# Patient Record
Sex: Female | Born: 1990 | Hispanic: Yes | Marital: Married | Smoking: Never smoker
Health system: Southern US, Community
[De-identification: ages and names within clinical notes are randomized; demographics above are authoritative.]

## PROBLEM LIST (undated history)

## (undated) ENCOUNTER — Ambulatory Visit: Admission: EM | Payer: Medicaid Other

## (undated) ENCOUNTER — Ambulatory Visit: Admission: EM | Payer: Medicaid Other | Source: Home / Self Care

## (undated) DIAGNOSIS — G43909 Migraine, unspecified, not intractable, without status migrainosus: Secondary | ICD-10-CM

## (undated) DIAGNOSIS — J45909 Unspecified asthma, uncomplicated: Secondary | ICD-10-CM

---
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
## 2012-11-25 ENCOUNTER — Emergency Department: Payer: Self-pay | Admitting: Emergency Medicine

## 2013-03-19 ENCOUNTER — Emergency Department: Payer: Self-pay | Admitting: Emergency Medicine

## 2013-03-19 LAB — CBC
HCT: 39.4 % (ref 35.0–47.0)
HGB: 13.8 g/dL (ref 12.0–16.0)
MCH: 31.9 pg (ref 26.0–34.0)
Platelet: 260 10*3/uL (ref 150–440)
RBC: 4.33 10*6/uL (ref 3.80–5.20)
RDW: 13 % (ref 11.5–14.5)
WBC: 8.4 10*3/uL (ref 3.6–11.0)

## 2013-03-19 LAB — URINALYSIS, COMPLETE
Glucose,UR: NEGATIVE mg/dL (ref 0–75)
Ketone: NEGATIVE
Nitrite: NEGATIVE
Protein: NEGATIVE
Specific Gravity: 1.008 (ref 1.003–1.030)
Squamous Epithelial: <1

## 2013-03-19 LAB — COMPREHENSIVE METABOLIC PANEL
Albumin: 4.2 g/dL (ref 3.8–5.6)
BUN: 11 mg/dL (ref 7–18)
Bilirubin,Total: 0.7 mg/dL (ref 0.2–1.0)
Calcium, Total: 8.7 mg/dL — ABNORMAL LOW (ref 9.0–10.7)
Co2: 29 mmol/L (ref 21–32)
Creatinine: 0.55 mg/dL — ABNORMAL LOW (ref 0.60–1.30)
EGFR (Non-African Amer.): >60
Glucose: 82 mg/dL (ref 65–99)
Potassium: 3.7 mmol/L (ref 3.5–5.1)
SGOT(AST): 24 U/L (ref 0–26)
SGPT (ALT): 37 U/L (ref 12–78)
Total Protein: 7.6 g/dL (ref 6.4–8.6)

## 2013-05-20 ENCOUNTER — Emergency Department: Payer: Self-pay | Admitting: Emergency Medicine

## 2013-05-20 LAB — BASIC METABOLIC PANEL
BUN: 9 mg/dL (ref 7–18)
Co2: 27 mmol/L (ref 21–32)
Creatinine: 0.59 mg/dL — ABNORMAL LOW (ref 0.60–1.30)
EGFR (African American): >60
EGFR (Non-African Amer.): >60
Glucose: 93 mg/dL (ref 65–99)
Osmolality: 278 (ref 275–301)

## 2014-05-25 ENCOUNTER — Emergency Department: Payer: Self-pay | Admitting: Internal Medicine

## 2014-05-25 LAB — COMPREHENSIVE METABOLIC PANEL
ANION GAP: 6 — AB (ref 7–16)
Albumin: 3.8 g/dL (ref 3.4–5.0)
Alkaline Phosphatase: 67 U/L
BILIRUBIN TOTAL: 0.6 mg/dL (ref 0.2–1.0)
BUN: 6 mg/dL — AB (ref 7–18)
CHLORIDE: 108 mmol/L — AB (ref 98–107)
Calcium, Total: 9.1 mg/dL (ref 8.5–10.1)
Co2: 24 mmol/L (ref 21–32)
Creatinine: 0.76 mg/dL (ref 0.60–1.30)
EGFR (African American): >60
EGFR (Non-African Amer.): >60
Glucose: 84 mg/dL (ref 65–99)
Osmolality: 272 (ref 275–301)
Potassium: 3.8 mmol/L (ref 3.5–5.1)
SGOT(AST): 23 U/L (ref 15–37)
SGPT (ALT): 21 U/L (ref 12–78)
Sodium: 138 mmol/L (ref 136–145)
Total Protein: 7.4 g/dL (ref 6.4–8.2)

## 2014-05-25 LAB — CBC
HCT: 38.5 % (ref 35.0–47.0)
HGB: 13.2 g/dL (ref 12.0–16.0)
MCH: 31.5 pg (ref 26.0–34.0)
MCHC: 34.2 g/dL (ref 32.0–36.0)
MCV: 92 fL (ref 80–100)
Platelet: 271 10*3/uL (ref 150–440)
RBC: 4.19 10*6/uL (ref 3.80–5.20)
RDW: 12.5 % (ref 11.5–14.5)
WBC: 14.1 10*3/uL — ABNORMAL HIGH (ref 3.6–11.0)

## 2014-05-25 LAB — URINALYSIS, COMPLETE
Bilirubin,UR: NEGATIVE
Glucose,UR: NEGATIVE mg/dL (ref 0–75)
KETONE: NEGATIVE
Nitrite: NEGATIVE
PH: 6 (ref 4.5–8.0)
Protein: NEGATIVE
RBC,UR: 30 /HPF (ref 0–5)
SPECIFIC GRAVITY: 1.013 (ref 1.003–1.030)

## 2014-05-27 LAB — URINE CULTURE

## 2014-07-11 ENCOUNTER — Emergency Department: Payer: Self-pay | Admitting: Emergency Medicine

## 2015-08-15 ENCOUNTER — Emergency Department: Payer: Self-pay

## 2015-08-15 ENCOUNTER — Encounter: Payer: Self-pay | Admitting: Emergency Medicine

## 2015-08-15 ENCOUNTER — Emergency Department
Admission: EM | Admit: 2015-08-15 | Discharge: 2015-08-15 | Disposition: A | Discharge status: Home / Self Care | Payer: Self-pay | Attending: Student | Admitting: Student

## 2015-08-15 DIAGNOSIS — Z3202 Encounter for pregnancy test, result negative: Secondary | ICD-10-CM | POA: Insufficient documentation

## 2015-08-15 DIAGNOSIS — G43001 Migraine without aura, not intractable, with status migrainosus: Secondary | ICD-10-CM | POA: Insufficient documentation

## 2015-08-15 LAB — POCT PREGNANCY, URINE: Preg Test, Ur: NEGATIVE

## 2015-08-15 MED ORDER — SODIUM CHLORIDE 0.9 % IV BOLUS (SEPSIS)
1000.0000 mL | Freq: Once | INTRAVENOUS | Status: AC
  Administered 2015-08-15: 1000 mL via INTRAVENOUS

## 2015-08-15 MED ORDER — KETOROLAC TROMETHAMINE 30 MG/ML IJ SOLN
30.0000 mg | Freq: Once | INTRAMUSCULAR | Status: AC
  Administered 2015-08-15: 30 mg via INTRAVENOUS
  Filled 2015-08-15: qty 1

## 2015-08-15 MED ORDER — DIPHENHYDRAMINE HCL 50 MG/ML IJ SOLN
12.5000 mg | Freq: Once | INTRAMUSCULAR | Status: AC
  Administered 2015-08-15: 12.5 mg via INTRAVENOUS

## 2015-08-15 MED ORDER — DIPHENHYDRAMINE HCL 12.5 MG/5ML PO ELIX
12.5000 mg | ORAL_SOLUTION | Freq: Once | ORAL | Status: DC
  Filled 2015-08-15: qty 5

## 2015-08-15 MED ORDER — BUTALBITAL-APAP-CAFFEINE 50-325-40 MG PO TABS: 1.0000 | ORAL_TABLET | Freq: Four times a day (QID) | ORAL | Status: DC | PRN

## 2015-08-15 MED ORDER — DIPHENHYDRAMINE HCL 50 MG/ML IJ SOLN
INTRAMUSCULAR | Status: AC
  Administered 2015-08-15: 12.5 mg via INTRAVENOUS
  Filled 2015-08-15: qty 1

## 2015-08-15 MED ORDER — METOCLOPRAMIDE HCL 5 MG/ML IJ SOLN
20.0000 mg | Freq: Once | INTRAMUSCULAR | Status: AC
  Administered 2015-08-15: 20 mg via INTRAVENOUS
  Filled 2015-08-15 (×2): qty 4

## 2015-08-15 NOTE — ED Provider Notes (Signed)
[facility] Emergency Department Provider Note  ____________________________________________  Time seen: Approximately 12:55 PM  I have reviewed the triage vital signs and the nursing notes.   HISTORY  Chief Complaint Headache    HPI Tiffany Diaz is a 24 y.o. female patient complaining of severe headache which started last night. Patient stated work it became increasingly more painful and she began has not been tenderness in the upper extremities. Patient state her coworkers said if she seems to be disorientated. Patient has a history of migraine headaches has not had a headache in over a year. No palliative measures taken of his complaint. Patient is rating the headache as a 10 over 10 systems were she's ever had.   History reviewed. No pertinent past medical history.  There are no active problems to display for this patient.   History reviewed. No pertinent past surgical history.  Current Outpatient Rx  Name  Route  Sig  Dispense  Refill  . butalbital-acetaminophen-caffeine (FIORICET) 50-325-40 MG per tablet   Oral   Take 1-2 tablets by mouth every 6 (six) hours as needed for headache.   20 tablet   0     Allergies Morphine and related  History reviewed. No pertinent family history.  Social History Social History  Substance Use Topics  . Smoking status: Never Smoker   . Smokeless tobacco: None  . Alcohol Use: No    Review of Systems Constitutional: No fever/chills Eyes: No visual changes. ENT: No sore throat. Cardiovascular: Denies chest pain. Respiratory: Denies shortness of breath. Gastrointestinal: No abdominal pain.  No nausea, no vomiting.  No diarrhea.  No constipation. Genitourinary: Negative for dysuria. Musculoskeletal: Negative for back pain. Skin: Negative for rash. Neurological: Positive for headache denies weakness but states she's having some bilateral numbness in upper extremities. 10-point ROS otherwise  negative.  ____________________________________________   PHYSICAL EXAM:  VITAL SIGNS: ED Triage Vitals  Enc Vitals Group     BP 08/15/15 1203 148/91 mmHg     Pulse Rate 08/15/15 1203 78     Resp 08/15/15 1203 20     Temp 08/15/15 1203 98.3 F (36.8 C)     Temp Source 08/15/15 1203 Oral     SpO2 08/15/15 1203 100 %     Weight 08/15/15 1203 180 lb (81.647 kg)     Height 08/15/15 1203  (1.6 m)     Head Cir --      Peak Flow --      Pain Score 08/15/15 1204 10     Pain Loc --      Pain Edu? --      Excl. in GC? --     Constitutional: Alert and oriented. Well appearing and in no acute distress. Eyes: Conjunctivae are normal. PERRL. EOMI. Head: Atraumatic. Nose: No congestion/rhinnorhea. Mouth/Throat: Mucous membranes are moist.  Oropharynx non-erythematous. Neck: No stridor.   Hematological/Lymphatic/Immunilogical: No cervical lymphadenopathy. Cardiovascular: Normal rate, regular rhythm. Grossly normal heart sounds.  Good peripheral circulation. Respiratory: Normal respiratory effort.  No retractions. Lungs CTAB. Gastrointestinal: Soft and nontender. No distention. No abdominal bruits. No CVA tenderness. Musculoskeletal: No lower extremity tenderness nor edema.  No joint effusions. Neurologic:  Normal speech and language. No gross focal neurologic deficits are appreciated. No gait instability. Skin:  Skin is warm, dry and intact. No rash noted. Psychiatric: Mood and affect are normal. Speech and behavior are normal.  ____________________________________________   LABS (all labs ordered are listed, but only abnormal results are displayed)  Labs Reviewed  POC URINE PREG, ED  POCT PREGNANCY, URINE   ____________________________________________  EKG   ____________________________________________  RADIOLOGY  CT scan was unremarkable. ____________________________________________   PROCEDURES  Procedure(s) performed: None  Critical Care performed:  No  ____________________________________________   INITIAL IMPRESSION / ASSESSMENT AND PLAN / ED COURSE  Pertinent labs & imaging results that were available during my care of the patient were reviewed by me and considered in my medical decision making (see chart for details). Migraine headache. Patient was given Toradol, Reglan, Benadryl and IV hydration. H will be discharged prescription for Esgic. Patient given a work note and advised to follow-up with her PCP._______________________________________   FINAL CLINICAL IMPRESSION(S) / ED DIAGNOSES  Final diagnoses:  Migraine without aura and with status migrainosus, not intractable      Joni Reining, PA-C 08/15/15 1545  Joni Reining, PA-C 08/15/15 1551  Gayla Doss, MD 08/15/15 1555

## 2015-08-15 NOTE — Discharge Instructions (Signed)

## 2015-08-15 NOTE — ED Notes (Signed)
Patient to ED with report of headache that started last night, this morning headache became increasingly worse and she began having numbness and tingling in her fingers.
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
## 2018-08-12 DIAGNOSIS — Z8669 Personal history of other diseases of the nervous system and sense organs: Secondary | ICD-10-CM | POA: Insufficient documentation

## 2018-08-12 DIAGNOSIS — Z91410 Personal history of adult physical and sexual abuse: Secondary | ICD-10-CM | POA: Insufficient documentation

## 2018-08-12 LAB — HM PAP SMEAR: HM Pap smear: NEGATIVE

## 2018-08-12 LAB — HIV ANTIBODY (ROUTINE TESTING W REFLEX): HIV: NONREACTIVE

## 2018-08-13 DIAGNOSIS — J45909 Unspecified asthma, uncomplicated: Secondary | ICD-10-CM | POA: Insufficient documentation

## 2018-08-31 DIAGNOSIS — F4323 Adjustment disorder with mixed anxiety and depressed mood: Secondary | ICD-10-CM | POA: Insufficient documentation

## 2018-11-04 ENCOUNTER — Ambulatory Visit: Payer: Self-pay

## 2018-11-08 DIAGNOSIS — Z6841 Body Mass Index (BMI) 40.0 and over, adult: Secondary | ICD-10-CM

## 2019-01-05 DIAGNOSIS — F4323 Adjustment disorder with mixed anxiety and depressed mood: Secondary | ICD-10-CM

## 2019-01-05 DIAGNOSIS — Z6841 Body Mass Index (BMI) 40.0 and over, adult: Secondary | ICD-10-CM

## 2019-01-05 DIAGNOSIS — Z91410 Personal history of adult physical and sexual abuse: Secondary | ICD-10-CM

## 2019-01-05 DIAGNOSIS — Z8669 Personal history of other diseases of the nervous system and sense organs: Secondary | ICD-10-CM

## 2019-01-08 ENCOUNTER — Encounter: Payer: Self-pay | Admitting: Gynecology

## 2019-01-08 ENCOUNTER — Ambulatory Visit
Admission: EM | Admit: 2019-01-08 | Discharge: 2019-01-08 | Disposition: A | Discharge status: Home / Self Care | Payer: BC Managed Care – PPO | Attending: Family Medicine | Admitting: Family Medicine

## 2019-01-08 ENCOUNTER — Other Ambulatory Visit: Payer: Self-pay

## 2019-01-08 DIAGNOSIS — R0982 Postnasal drip: Secondary | ICD-10-CM | POA: Diagnosis not present

## 2019-01-08 DIAGNOSIS — Z3202 Encounter for pregnancy test, result negative: Secondary | ICD-10-CM

## 2019-01-08 DIAGNOSIS — R5383 Other fatigue: Secondary | ICD-10-CM | POA: Diagnosis not present

## 2019-01-08 DIAGNOSIS — J111 Influenza due to unidentified influenza virus with other respiratory manifestations: Secondary | ICD-10-CM

## 2019-01-08 DIAGNOSIS — R05 Cough: Secondary | ICD-10-CM | POA: Diagnosis not present

## 2019-01-08 DIAGNOSIS — R0981 Nasal congestion: Secondary | ICD-10-CM | POA: Diagnosis not present

## 2019-01-08 DIAGNOSIS — R69 Illness, unspecified: Secondary | ICD-10-CM

## 2019-01-08 HISTORY — DX: Migraine, unspecified, not intractable, without status migrainosus: G43.909

## 2019-01-08 HISTORY — DX: Unspecified asthma, uncomplicated: J45.909

## 2019-01-08 LAB — PREGNANCY, URINE: Preg Test, Ur: NEGATIVE

## 2019-01-08 MED ORDER — OSELTAMIVIR PHOSPHATE 75 MG PO CAPS: 75.0000 mg | ORAL_CAPSULE | Freq: Two times a day (BID) | ORAL | 0 refills | Status: DC
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
## 2019-01-08 NOTE — ED Triage Notes (Signed)
Patient c/o headache and fever this morning of 100.2 and body ache.

## 2019-05-02 ENCOUNTER — Encounter: Payer: Self-pay | Admitting: Emergency Medicine

## 2019-05-02 ENCOUNTER — Other Ambulatory Visit: Payer: Self-pay

## 2019-05-02 ENCOUNTER — Emergency Department
Admission: EM | Admit: 2019-05-02 | Discharge: 2019-05-02 | Disposition: A | Discharge status: Home / Self Care | Payer: BC Managed Care – PPO | Attending: Emergency Medicine | Admitting: Emergency Medicine

## 2019-05-02 ENCOUNTER — Emergency Department: Payer: BC Managed Care – PPO

## 2019-05-02 DIAGNOSIS — N939 Abnormal uterine and vaginal bleeding, unspecified: Secondary | ICD-10-CM | POA: Diagnosis not present

## 2019-05-02 DIAGNOSIS — J45909 Unspecified asthma, uncomplicated: Secondary | ICD-10-CM | POA: Diagnosis not present

## 2019-05-02 DIAGNOSIS — Z79899 Other long term (current) drug therapy: Secondary | ICD-10-CM | POA: Diagnosis not present

## 2019-05-02 LAB — WET PREP, GENITAL
Clue Cells Wet Prep HPF POC: NONE SEEN
Sperm: NONE SEEN
Trich, Wet Prep: NONE SEEN
Yeast Wet Prep HPF POC: NONE SEEN

## 2019-05-02 LAB — CHLAMYDIA/NGC RT PCR (ARMC ONLY)??????????
Chlamydia Tr: NOT DETECTED
N gonorrhoeae: NOT DETECTED

## 2019-05-02 LAB — URINALYSIS, COMPLETE (UACMP) WITH MICROSCOPIC
Bacteria, UA: NONE SEEN
Bilirubin Urine: NEGATIVE
Glucose, UA: NEGATIVE mg/dL
Hgb urine dipstick: NEGATIVE
Ketones, ur: NEGATIVE mg/dL
Leukocytes,Ua: NEGATIVE
Nitrite: NEGATIVE
Protein, ur: NEGATIVE mg/dL
Specific Gravity, Urine: 1.023 (ref 1.005–1.030)
pH: 5 (ref 5.0–8.0)

## 2019-05-02 LAB — POCT PREGNANCY, URINE: Preg Test, Ur: NEGATIVE

## 2019-05-02 MED ORDER — SUMATRIPTAN SUCCINATE 100 MG PO TABS: ORAL_TABLET | ORAL | 0 refills | Status: DC

## 2019-05-02 MED ORDER — IBUPROFEN 600 MG PO TABS: 600.0000 mg | ORAL_TABLET | Freq: Three times a day (TID) | ORAL | 0 refills | Status: DC | PRN

## 2019-05-02 MED ORDER — MEDROXYPROGESTERONE ACETATE 5 MG PO TABS: 5.0000 mg | ORAL_TABLET | Freq: Every day | ORAL | 0 refills | Status: DC

## 2019-05-02 MED ORDER — ONDANSETRON 4 MG PO TBDP: 4.0000 mg | ORAL_TABLET | Freq: Three times a day (TID) | ORAL | 0 refills | Status: DC | PRN

## 2019-05-02 MED ORDER — KETOROLAC TROMETHAMINE 60 MG/2ML IM SOLN
60.0000 mg | Freq: Once | INTRAMUSCULAR | Status: AC
  Administered 2019-05-02: 60 mg via INTRAMUSCULAR
  Filled 2019-05-02: qty 2

## 2019-05-02 NOTE — ED Notes (Signed)
Patient discharge instructions reviewed with patient by this RN and MD. Patient left after speaking with MD, prior to signing for discharge. Patient verbalized understanding of discharge instructions.

## 2019-05-02 NOTE — Discharge Instructions (Addendum)
Take the birth control as prescribed.  After the course of birth control, you should have a normal period.

## 2019-05-02 NOTE — ED Triage Notes (Addendum)
Pt presents with c/o one short episode of vaginal bleeding that occured around 9pm last night; says she passed a "blob that looked like chicken skin"; she thinks she may be having a miscarriage; pt says LMP was 04/06/19; she has taken a home pregnancy test and had a "very faint line"; pt says she has been nauseated since her last period and that's why she took the home pregnancy test; reports cramping to abd and mid back;
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
## 2019-12-16 ENCOUNTER — Other Ambulatory Visit: Payer: Self-pay

## 2019-12-16 DIAGNOSIS — R079 Chest pain, unspecified: Secondary | ICD-10-CM | POA: Insufficient documentation

## 2019-12-16 DIAGNOSIS — R0602 Shortness of breath: Secondary | ICD-10-CM | POA: Diagnosis not present

## 2019-12-16 DIAGNOSIS — Z5321 Procedure and treatment not carried out due to patient leaving prior to being seen by health care provider: Secondary | ICD-10-CM | POA: Insufficient documentation

## 2019-12-16 LAB — CBC WITH DIFFERENTIAL/PLATELET
Abs Immature Granulocytes: 0.02 10*3/uL (ref 0.00–0.07)
Basophils Absolute: 0 10*3/uL (ref 0.0–0.1)
Basophils Relative: 0 %
Eosinophils Absolute: 0.1 10*3/uL (ref 0.0–0.5)
Eosinophils Relative: 1 %
HCT: 36.8 % (ref 36.0–46.0)
Hemoglobin: 12.5 g/dL (ref 12.0–15.0)
Immature Granulocytes: 0 %
Lymphocytes Relative: 33 %
Lymphs Abs: 2.1 10*3/uL (ref 0.7–4.0)
MCH: 28.9 pg (ref 26.0–34.0)
MCHC: 34 g/dL (ref 30.0–36.0)
MCV: 85 fL (ref 80.0–100.0)
Monocytes Absolute: 0.5 10*3/uL (ref 0.1–1.0)
Monocytes Relative: 8 %
Neutro Abs: 3.7 10*3/uL (ref 1.7–7.7)
Neutrophils Relative %: 58 %
Platelets: 267 10*3/uL (ref 150–400)
RBC: 4.33 MIL/uL (ref 3.87–5.11)
RDW: 12.6 % (ref 11.5–15.5)
WBC: 6.4 10*3/uL (ref 4.0–10.5)
nRBC: 0 % (ref 0.0–0.2)

## 2019-12-16 LAB — POCT PREGNANCY, URINE: Preg Test, Ur: NEGATIVE

## 2019-12-16 NOTE — ED Triage Notes (Signed)
Patient reports short of breath since COVID + on 12/06/2019.  Patient reports she has had chest pain since Monday.  States sent by her MD to be checked for blood clot in her lungs.

## 2019-12-17 ENCOUNTER — Emergency Department
Admission: EM | Admit: 2019-12-17 | Discharge: 2019-12-17 | Disposition: A | Discharge status: Home / Self Care | Payer: BC Managed Care – PPO | Attending: Emergency Medicine | Admitting: Emergency Medicine

## 2019-12-17 ENCOUNTER — Emergency Department: Payer: BC Managed Care – PPO

## 2019-12-17 LAB — COMPREHENSIVE METABOLIC PANEL
ALT: 57 U/L — ABNORMAL HIGH (ref 0–44)
AST: 35 U/L (ref 15–41)
Albumin: 3.6 g/dL (ref 3.5–5.0)
Alkaline Phosphatase: 88 U/L (ref 38–126)
Anion gap: 10 (ref 5–15)
BUN: 8 mg/dL (ref 6–20)
CO2: 23 mmol/L (ref 22–32)
Calcium: 8.3 mg/dL — ABNORMAL LOW (ref 8.9–10.3)
Chloride: 106 mmol/L (ref 98–111)
Creatinine, Ser: 0.54 mg/dL (ref 0.44–1.00)
GFR calc Af Amer: >60 mL/min (ref >60)
GFR calc non Af Amer: >60 mL/min (ref >60)
Glucose, Bld: 100 mg/dL — ABNORMAL HIGH (ref 70–99)
Potassium: 3.7 mmol/L (ref 3.5–5.1)
Sodium: 139 mmol/L (ref 135–145)
Total Bilirubin: 0.7 mg/dL (ref 0.3–1.2)
Total Protein: 7.1 g/dL (ref 6.5–8.1)

## 2019-12-17 LAB — FIBRIN DERIVATIVES D-DIMER (ARMC ONLY): Fibrin derivatives D-dimer (ARMC): 256.37 ng/mL (FEU) (ref 0.00–499.00)

## 2019-12-17 LAB — TROPONIN I (HIGH SENSITIVITY): Troponin I (High Sensitivity): <2 ng/L (ref <18)

## 2019-12-17 NOTE — ED Notes (Signed)
Patient called for xray with no answer.

## 2019-12-17 NOTE — ED Notes (Signed)
Patient called for xray with no answer. 

## 2019-12-19 ENCOUNTER — Telehealth: Payer: Self-pay | Admitting: Emergency Medicine

## 2019-12-19 NOTE — Telephone Encounter (Signed)
Called patient due to lwot to inquire about condition and follow up plans. She says she has called her pcp. I told her that results should be visible to her pcp.

## 2020-03-26 ENCOUNTER — Other Ambulatory Visit: Payer: Self-pay

## 2020-03-26 ENCOUNTER — Emergency Department: Payer: BC Managed Care – PPO

## 2020-03-26 DIAGNOSIS — Z5321 Procedure and treatment not carried out due to patient leaving prior to being seen by health care provider: Secondary | ICD-10-CM | POA: Diagnosis not present

## 2020-03-26 DIAGNOSIS — R42 Dizziness and giddiness: Secondary | ICD-10-CM | POA: Diagnosis not present

## 2020-03-26 DIAGNOSIS — O99891 Other specified diseases and conditions complicating pregnancy: Secondary | ICD-10-CM | POA: Diagnosis not present

## 2020-03-26 DIAGNOSIS — Z3A01 Less than 8 weeks gestation of pregnancy: Secondary | ICD-10-CM | POA: Insufficient documentation

## 2020-03-26 DIAGNOSIS — R05 Cough: Secondary | ICD-10-CM | POA: Insufficient documentation

## 2020-03-26 DIAGNOSIS — R0602 Shortness of breath: Secondary | ICD-10-CM | POA: Diagnosis not present

## 2020-03-26 LAB — URINALYSIS, COMPLETE (UACMP) WITH MICROSCOPIC
Bilirubin Urine: NEGATIVE
Glucose, UA: NEGATIVE mg/dL
Hgb urine dipstick: NEGATIVE
Ketones, ur: NEGATIVE mg/dL
Leukocytes,Ua: NEGATIVE
Nitrite: NEGATIVE
Protein, ur: NEGATIVE mg/dL
Specific Gravity, Urine: 1.033 — ABNORMAL HIGH (ref 1.005–1.030)
pH: 5 (ref 5.0–8.0)

## 2020-03-26 LAB — CBC
HCT: 36.6 % (ref 36.0–46.0)
Hemoglobin: 12.3 g/dL (ref 12.0–15.0)
MCH: 29.5 pg (ref 26.0–34.0)
MCHC: 33.6 g/dL (ref 30.0–36.0)
MCV: 87.8 fL (ref 80.0–100.0)
Platelets: 335 10*3/uL (ref 150–400)
RBC: 4.17 MIL/uL (ref 3.87–5.11)
RDW: 13.1 % (ref 11.5–15.5)
WBC: 13.4 10*3/uL — ABNORMAL HIGH (ref 4.0–10.5)
nRBC: 0.2 % (ref 0.0–0.2)

## 2020-03-26 LAB — BASIC METABOLIC PANEL
Anion gap: 7 (ref 5–15)
BUN: 12 mg/dL (ref 6–20)
CO2: 23 mmol/L (ref 22–32)
Calcium: 8.8 mg/dL — ABNORMAL LOW (ref 8.9–10.3)
Chloride: 106 mmol/L (ref 98–111)
Creatinine, Ser: 0.66 mg/dL (ref 0.44–1.00)
GFR calc Af Amer: >60 mL/min (ref >60)
GFR calc non Af Amer: >60 mL/min (ref >60)
Glucose, Bld: 107 mg/dL — ABNORMAL HIGH (ref 70–99)
Potassium: 4 mmol/L (ref 3.5–5.1)
Sodium: 136 mmol/L (ref 135–145)

## 2020-03-26 LAB — POCT PREGNANCY, URINE: Preg Test, Ur: POSITIVE — AB

## 2020-03-26 NOTE — ED Triage Notes (Signed)
Pt arrives to ED via POV from home with c/o lightheadedness and SHOB x3-4 days. Pt reports productive cough with green mucus. Pt reports (+) nausea, but denies V/D. Pt is also [redacted] weeks pregnant; but without any pregnancy related c/o's. Pt is A&O, in NAD; RR even, regular, and unlabored.

## 2020-03-27 ENCOUNTER — Emergency Department
Admission: EM | Admit: 2020-03-27 | Discharge: 2020-03-27 | Disposition: A | Discharge status: Home / Self Care | Payer: BC Managed Care – PPO | Attending: Emergency Medicine | Admitting: Emergency Medicine

## 2020-03-30 ENCOUNTER — Encounter: Payer: Self-pay | Admitting: Emergency Medicine

## 2020-03-30 ENCOUNTER — Other Ambulatory Visit: Payer: Self-pay

## 2020-03-30 ENCOUNTER — Ambulatory Visit
Admission: EM | Admit: 2020-03-30 | Discharge: 2020-03-30 | Disposition: A | Discharge status: Home / Self Care | Payer: BC Managed Care – PPO | Attending: Urgent Care | Admitting: Urgent Care

## 2020-03-30 DIAGNOSIS — Z3A01 Less than 8 weeks gestation of pregnancy: Secondary | ICD-10-CM

## 2020-03-30 DIAGNOSIS — H66001 Acute suppurative otitis media without spontaneous rupture of ear drum, right ear: Secondary | ICD-10-CM

## 2020-03-30 DIAGNOSIS — R05 Cough: Secondary | ICD-10-CM

## 2020-03-30 DIAGNOSIS — R059 Cough, unspecified: Secondary | ICD-10-CM

## 2020-03-30 MED ORDER — AMOXICILLIN 875 MG PO TABS: 875.0000 mg | ORAL_TABLET | Freq: Two times a day (BID) | ORAL | 0 refills | Status: AC

## 2020-03-30 NOTE — Discharge Instructions (Addendum)
It was very nice seeing you today in clinic. Thank you for entrusting me with your care.   Rest and Stay HYDRATED. Water and electrolyte containing beverages (Gatorade, Pedialyte) are best to prevent dehydration and electrolyte abnormalities. Use medication as prescribed. Limited on what you can use during pregnancy. Continue Robitussin.  Make arrangements to follow up with your regular doctor in 1 week for re-evaluation if not improving. If your symptoms/condition worsens, please seek follow up care either here or in the ER. Please remember, our [facility] providers are "right here with you" when you need [place].   Again, it was my pleasure to take care of you today. Thank you for choosing our clinic. I hope that you start to feel better quickly.   Quentin Mulling, MSN, APRN, FNP-C, CEN Advanced Practice Provider [facility]

## 2020-03-30 NOTE — ED Triage Notes (Signed)
Patient was COVID tested on Tuesday and was Negative.

## 2020-03-30 NOTE — ED Triage Notes (Signed)
Patient c/o cough, congestion, post nasal drip, and headaches that started over a week ago.  Patient denies fevers.
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
## 2020-10-26 ENCOUNTER — Ambulatory Visit
Admission: EM | Admit: 2020-10-26 | Discharge: 2020-10-26 | Disposition: A | Discharge status: Home / Self Care | Payer: Medicaid Other | Attending: Emergency Medicine | Admitting: Emergency Medicine

## 2020-10-26 ENCOUNTER — Encounter: Payer: Self-pay | Admitting: Emergency Medicine

## 2020-10-26 ENCOUNTER — Other Ambulatory Visit: Payer: Self-pay

## 2020-10-26 DIAGNOSIS — Z20822 Contact with and (suspected) exposure to covid-19: Secondary | ICD-10-CM | POA: Insufficient documentation

## 2020-10-26 DIAGNOSIS — O99213 Obesity complicating pregnancy, third trimester: Secondary | ICD-10-CM | POA: Insufficient documentation

## 2020-10-26 DIAGNOSIS — J029 Acute pharyngitis, unspecified: Secondary | ICD-10-CM | POA: Diagnosis not present

## 2020-10-26 DIAGNOSIS — J069 Acute upper respiratory infection, unspecified: Secondary | ICD-10-CM

## 2020-10-26 DIAGNOSIS — J45909 Unspecified asthma, uncomplicated: Secondary | ICD-10-CM | POA: Insufficient documentation

## 2020-10-26 DIAGNOSIS — O99513 Diseases of the respiratory system complicating pregnancy, third trimester: Secondary | ICD-10-CM | POA: Insufficient documentation

## 2020-10-26 DIAGNOSIS — Z3A35 35 weeks gestation of pregnancy: Secondary | ICD-10-CM | POA: Insufficient documentation

## 2020-10-26 DIAGNOSIS — H9203 Otalgia, bilateral: Secondary | ICD-10-CM | POA: Insufficient documentation

## 2020-10-26 DIAGNOSIS — O26893 Other specified pregnancy related conditions, third trimester: Secondary | ICD-10-CM | POA: Insufficient documentation

## 2020-10-26 LAB — GROUP A STREP BY PCR: Group A Strep by PCR: NOT DETECTED

## 2020-10-26 LAB — RESP PANEL BY RT-PCR (FLU A&B, COVID) ARPGX2
Influenza A by PCR: NEGATIVE
Influenza B by PCR: NEGATIVE
SARS Coronavirus 2 by RT PCR: NEGATIVE
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
## 2020-10-26 NOTE — Discharge Instructions (Addendum)
I will call with the results of your strep test and respiratory swab in the next hour or 2.  I advised supportive care with increasing rest and fluids.  You can take Tylenol as needed for your pain.  You can also use Flonase and Claritin.  If he has strep I can send an antibiotic.  If you have flu I can send Tamiflu.  If you have COVID we can refer you to the infusion clinic for MAB therapy.

## 2020-10-26 NOTE — ED Triage Notes (Signed)
Patient c/o sinus congestion, HAs, sore throat, and runny nose that started 3 days ago.  Patient denies fevers.

## 2020-11-15 IMAGING — US TRANSVAGINAL ULTRASOUND OF PELVIS
1 series · 13 of 25 positions shown · non-contrast
Comparison: CT abdomen and pelvis May 25, 2014

CLINICAL DATA: Pelvic pain, right-sided

EXAM:
TRANSABDOMINAL AND TRANSVAGINAL ULTRASOUND OF PELVIS
DOPPLER ULTRASOUND OF OVARIES
TECHNIQUE: Study was performed transabdominally to optimize pelvic field of
view evaluation and transvaginally to optimize internal visceral
architecture evaluation. Color and duplex Doppler ultrasound was
utilized to evaluate blood flow to the ovaries.

[Series 1: transvaginal ultrasound of pelvis · 13 of 101 slices shown]
[im 1/101]
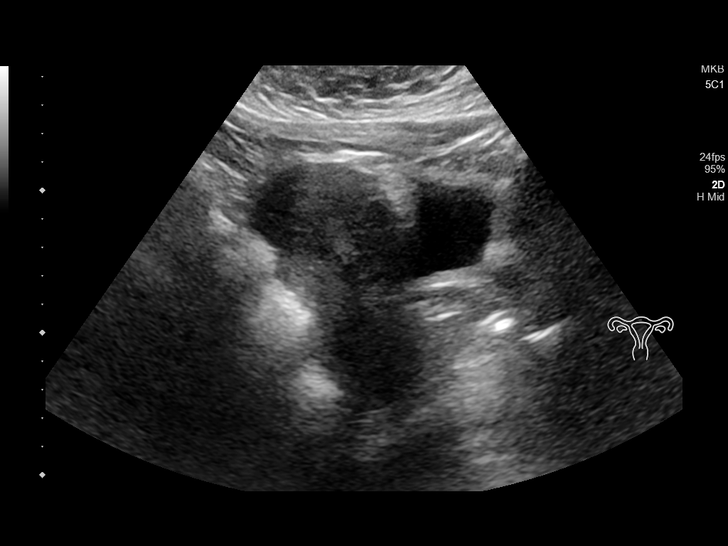
[im 9/101]
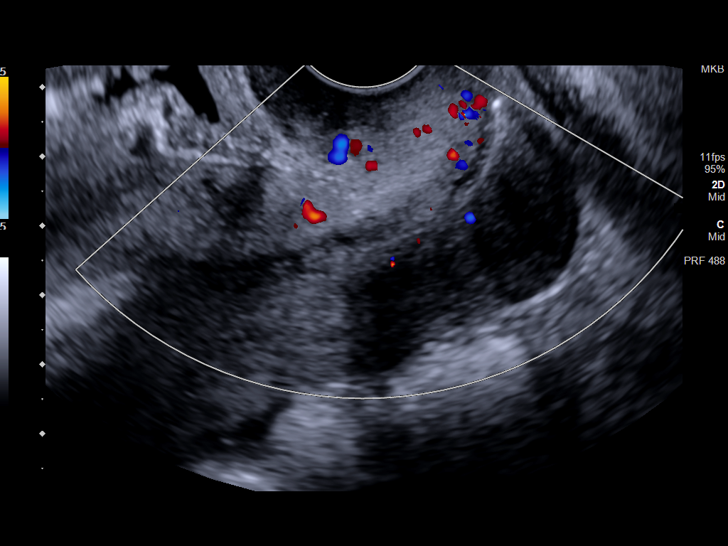
[im 17/101]
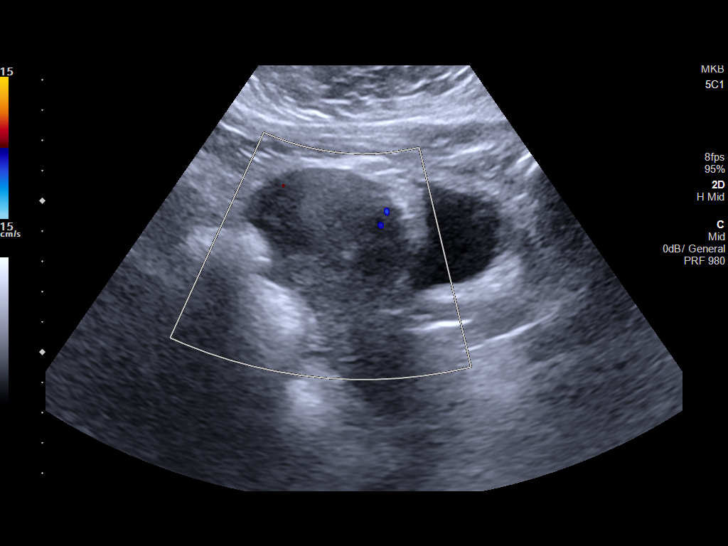
[im 26/101]
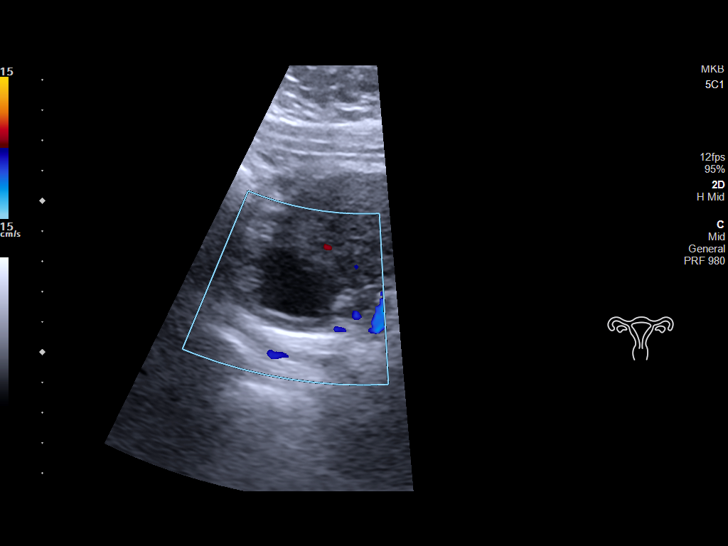
[im 34/101]
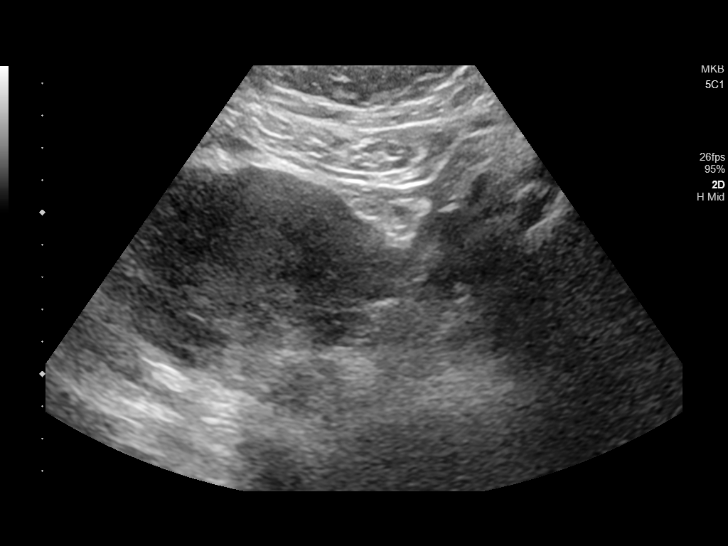
[im 42/101]
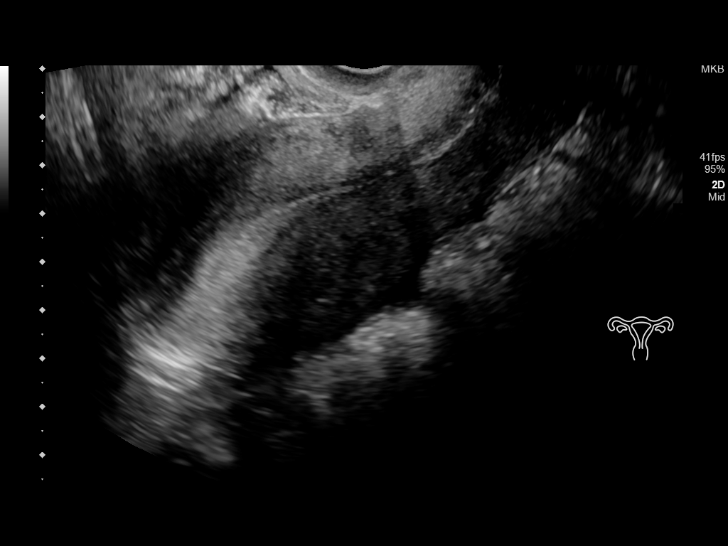
[im 51/101]
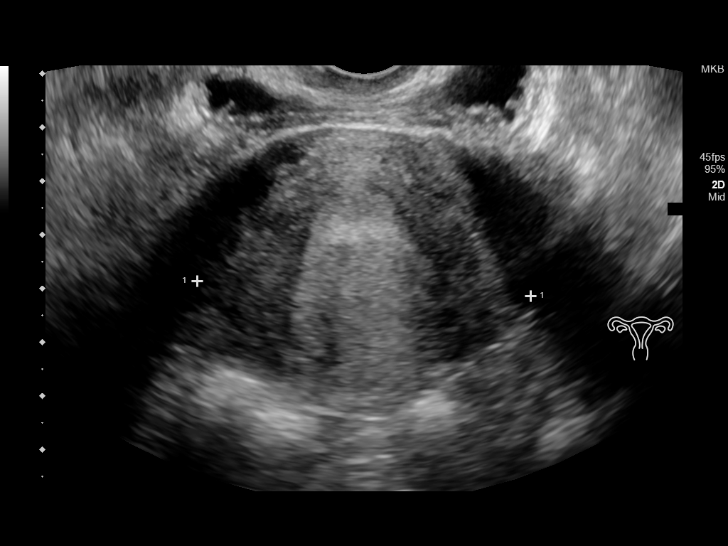
[im 59/101]
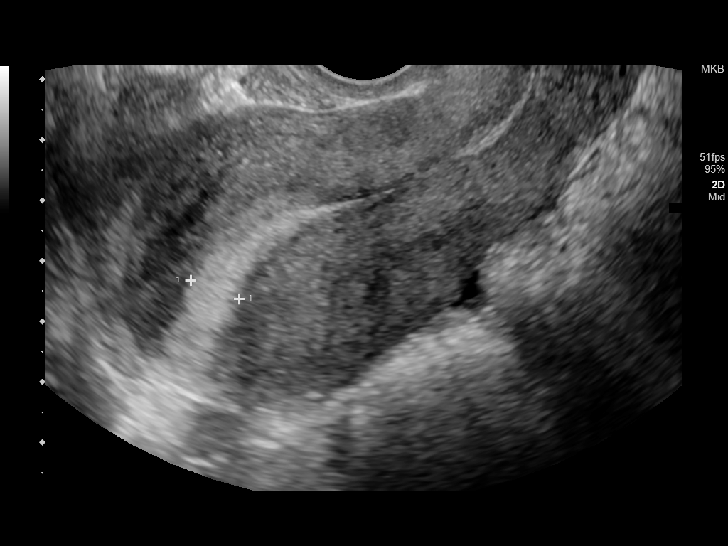
[im 67/101]
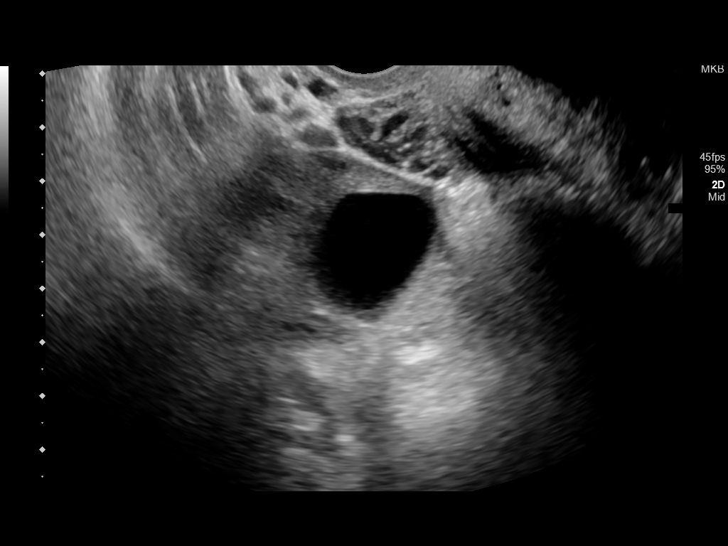
[im 76/101]
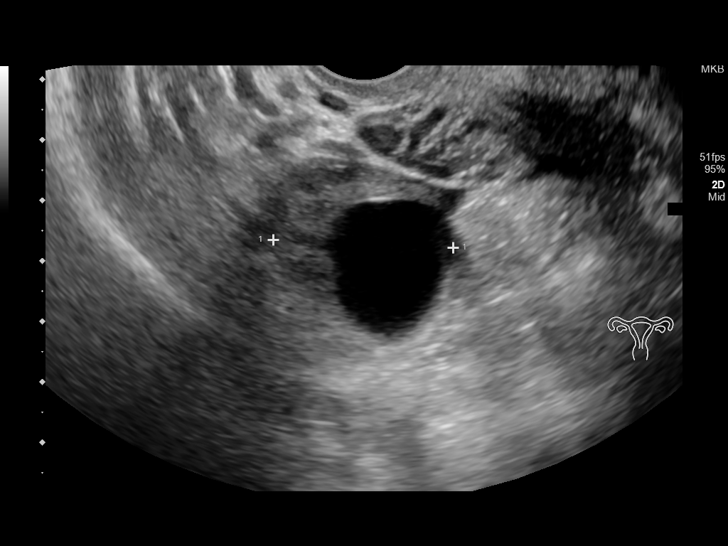
[im 84/101]
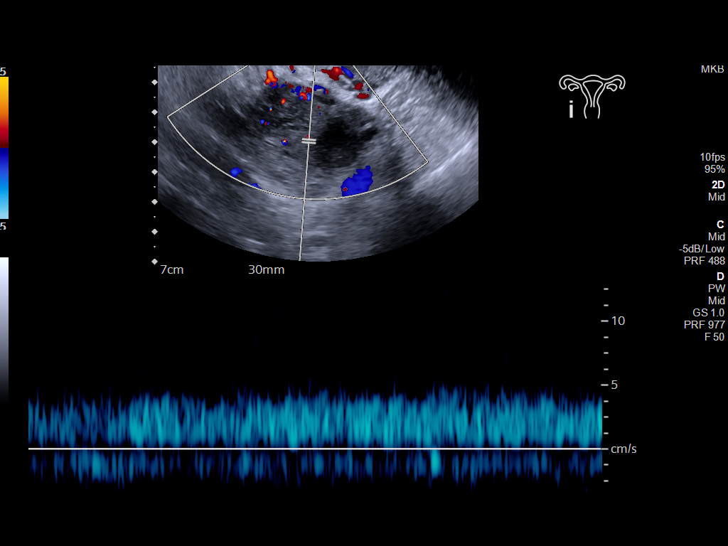
[im 92/101]
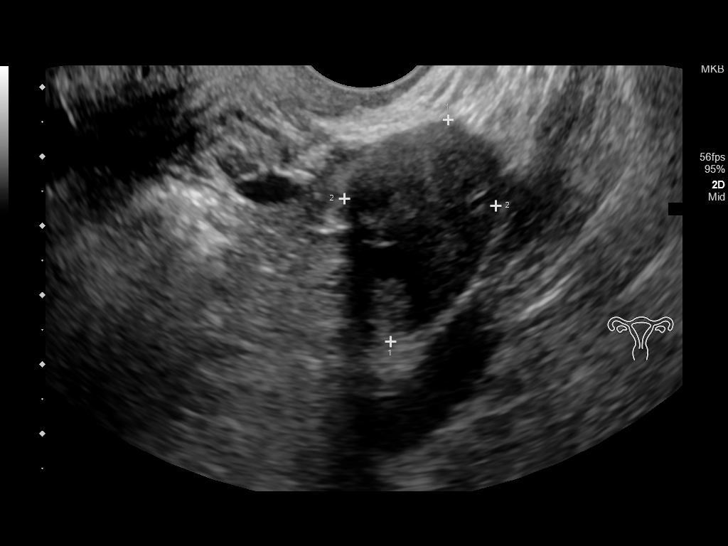
[im 101/101]
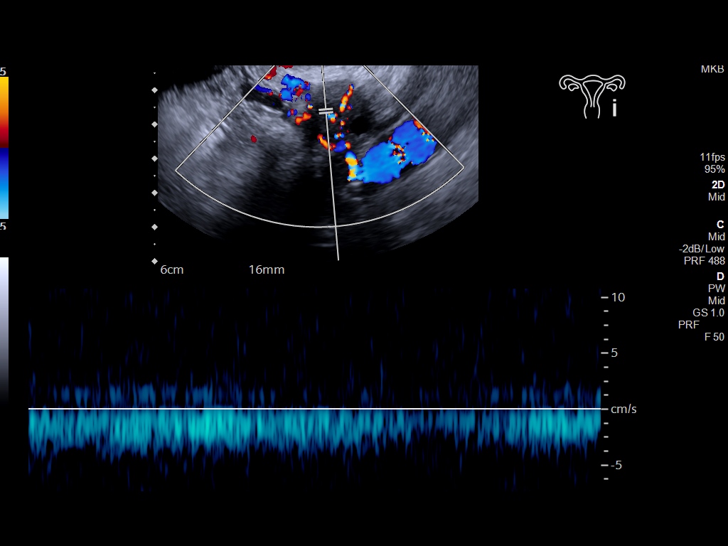

[13 of 25 positions shown; findings below may reference images not displayed]

FINDINGS: Uterus

Measurements: 9.1 x 4.8 x 6.7 cm = volume: 150.8 mL. No fibroids or
other mass visualized.

Endometrium

Thickness: 9 mm.  No focal abnormality visualized.

Right ovary

Measurements: 4.3 x 3.1 x 3.0 cm = volume: 20.7 mL. There is a
presumed dominant follicle arising from the right ovary measuring
2.8 x 2.3 x 2.4 cm. No other extrauterine pelvic mass evident on the
right.

Left ovary

Measurements: 3.3 x 2.2 x 2.3 cm = volume: 12.0 mL. Normal
appearance/no adnexal mass.

Pulsed Doppler evaluation of both ovaries demonstrates normal
low-resistance arterial and venous waveforms.

Other findings

No abnormal free fluid.
IMPRESSION: 1. Presumed dominant follicle right ovary. Right ovary otherwise
appears unremarkable. No other extrauterine pelvic masses. No
evident free pelvic fluid.

2.  No demonstrable torsion of either ovary.

3.  Uterus and endometrium appear unremarkable.

## 2020-12-11 ENCOUNTER — Observation Stay
Admission: EM | Admit: 2020-12-11 | Discharge: 2020-12-12 | Disposition: A | Discharge status: Home / Self Care | Payer: Medicaid Other

## 2020-12-11 ENCOUNTER — Emergency Department: Payer: Medicaid Other

## 2020-12-11 ENCOUNTER — Other Ambulatory Visit: Payer: Self-pay

## 2020-12-11 DIAGNOSIS — R001 Bradycardia, unspecified: Secondary | ICD-10-CM | POA: Diagnosis not present

## 2020-12-11 DIAGNOSIS — R42 Dizziness and giddiness: Secondary | ICD-10-CM | POA: Diagnosis not present

## 2020-12-11 DIAGNOSIS — R5383 Other fatigue: Secondary | ICD-10-CM | POA: Diagnosis not present

## 2020-12-11 DIAGNOSIS — Z885 Allergy status to narcotic agent status: Secondary | ICD-10-CM | POA: Diagnosis not present

## 2020-12-11 DIAGNOSIS — D649 Anemia, unspecified: Secondary | ICD-10-CM | POA: Insufficient documentation

## 2020-12-11 DIAGNOSIS — Z20822 Contact with and (suspected) exposure to covid-19: Secondary | ICD-10-CM | POA: Insufficient documentation

## 2020-12-11 DIAGNOSIS — N939 Abnormal uterine and vaginal bleeding, unspecified: Secondary | ICD-10-CM

## 2020-12-11 DIAGNOSIS — J45909 Unspecified asthma, uncomplicated: Secondary | ICD-10-CM | POA: Insufficient documentation

## 2020-12-11 LAB — CBC
HCT: 37 % (ref 36.0–46.0)
Hemoglobin: 12.3 g/dL (ref 12.0–15.0)
MCH: 28.1 pg (ref 26.0–34.0)
MCHC: 33.2 g/dL (ref 30.0–36.0)
MCV: 84.7 fL (ref 80.0–100.0)
Platelets: 331 10*3/uL (ref 150–400)
RBC: 4.37 MIL/uL (ref 3.87–5.11)
RDW: 13.5 % (ref 11.5–15.5)
WBC: 9.1 10*3/uL (ref 4.0–10.5)
nRBC: 0 % (ref 0.0–0.2)

## 2020-12-11 LAB — BASIC METABOLIC PANEL
Anion gap: 8 (ref 5–15)
BUN: 14 mg/dL (ref 6–20)
CO2: 25 mmol/L (ref 22–32)
Calcium: 9.2 mg/dL (ref 8.9–10.3)
Chloride: 104 mmol/L (ref 98–111)
Creatinine, Ser: 0.64 mg/dL (ref 0.44–1.00)
GFR, Estimated: >60 mL/min (ref >60)
Glucose, Bld: 98 mg/dL (ref 70–99)
Potassium: 4.3 mmol/L (ref 3.5–5.1)
Sodium: 137 mmol/L (ref 135–145)

## 2020-12-11 LAB — POC SARS CORONAVIRUS 2 AG -  ED: SARS Coronavirus 2 Ag: NEGATIVE

## 2020-12-11 LAB — HEMOGLOBIN AND HEMATOCRIT, BLOOD
HCT: 25.7 % — ABNORMAL LOW (ref 36.0–46.0)
Hemoglobin: 8.4 g/dL — ABNORMAL LOW (ref 12.0–15.0)

## 2020-12-11 LAB — HCG, QUANTITATIVE, PREGNANCY: hCG, Beta Chain, Quant, S: 1 m[IU]/mL (ref <5)

## 2020-12-11 LAB — PREPARE RBC (CROSSMATCH)

## 2020-12-11 LAB — ABO/RH: ABO/RH(D): O POS

## 2020-12-11 MED ORDER — METHYLERGONOVINE MALEATE 0.2 MG PO TABS
0.2000 mg | ORAL_TABLET | Freq: Four times a day (QID) | ORAL | Status: DC
  Administered 2020-12-12 (×3): 0.2 mg via ORAL
  Filled 2020-12-11 (×6): qty 1

## 2020-12-11 MED ORDER — ACETAMINOPHEN 500 MG PO TABS
1000.0000 mg | ORAL_TABLET | Freq: Once | ORAL | Status: AC
  Administered 2020-12-11: 1000 mg via ORAL
  Filled 2020-12-11: qty 2

## 2020-12-11 MED ORDER — SODIUM CHLORIDE 0.9% IV SOLUTION
Freq: Once | INTRAVENOUS | Status: DC
  Filled 2020-12-11: qty 250

## 2020-12-11 MED ORDER — ACETAMINOPHEN 500 MG PO TABS
1000.0000 mg | ORAL_TABLET | Freq: Four times a day (QID) | ORAL | Status: DC | PRN
  Administered 2020-12-12: 1000 mg via ORAL
  Filled 2020-12-11: qty 2

## 2020-12-11 NOTE — ED Triage Notes (Signed)
Pt here via POV from home with c/o vaginal bleeding. Pt states she had baby 5 weeks ago vaginally. Pt states she had to have a blood transfusion.  Pt states today she started bleeding and has been soaking through several pads. Pt states she is now soaked and feels cold and dizzy.
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
## 2020-12-11 NOTE — ED Notes (Signed)
POC covid test negative 

## 2020-12-11 NOTE — Progress Notes (Signed)
TOC consult placed to assess for safety and resource needs
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
## 2020-12-12 LAB — CBC
HCT: 36.3 % (ref 36.0–46.0)
Hemoglobin: 11.8 g/dL — ABNORMAL LOW (ref 12.0–15.0)
MCH: 27.4 pg (ref 26.0–34.0)
MCHC: 32.5 g/dL (ref 30.0–36.0)
MCV: 84.4 fL (ref 80.0–100.0)
Platelets: 274 10*3/uL (ref 150–400)
RBC: 4.3 MIL/uL (ref 3.87–5.11)
RDW: 13.8 % (ref 11.5–15.5)
WBC: 7.3 10*3/uL (ref 4.0–10.5)
nRBC: 0 % (ref 0.0–0.2)

## 2020-12-12 LAB — TYPE AND SCREEN
ABO/RH(D): O POS
Antibody Screen: NEGATIVE
Unit division: 0

## 2020-12-12 LAB — BPAM RBC
Blood Product Expiration Date: 202202122359
ISSUE DATE / TIME: 202201112303
Unit Type and Rh: 5100

## 2020-12-12 LAB — SARS CORONAVIRUS 2 (TAT 6-24 HRS): SARS Coronavirus 2: NEGATIVE

## 2020-12-12 LAB — PROTIME-INR
INR: 1 (ref 0.8–1.2)
Prothrombin Time: 12.6 seconds (ref 11.4–15.2)

## 2020-12-12 MED ORDER — IBUPROFEN 600 MG PO TABS
600.0000 mg | ORAL_TABLET | Freq: Four times a day (QID) | ORAL | 0 refills | Status: DC | PRN
Start: 2020-12-12 — End: 2021-11-13

## 2020-12-12 MED ORDER — ACETAMINOPHEN 500 MG PO TABS: 1000.0000 mg | ORAL_TABLET | Freq: Four times a day (QID) | ORAL | 0 refills | Status: DC | PRN

## 2020-12-12 MED ORDER — LACTATED RINGERS IV SOLN: INTRAVENOUS | Status: DC

## 2020-12-12 MED ORDER — METHYLERGONOVINE MALEATE 0.2 MG PO TABS: 0.2000 mg | ORAL_TABLET | Freq: Four times a day (QID) | ORAL | 0 refills | Status: AC

## 2020-12-12 MED ORDER — HYDROXYZINE HCL 25 MG PO TABS
50.0000 mg | ORAL_TABLET | Freq: Every evening | ORAL | Status: DC | PRN
  Filled 2020-12-12: qty 2

## 2020-12-12 MED ORDER — IBUPROFEN 600 MG PO TABS
600.0000 mg | ORAL_TABLET | Freq: Four times a day (QID) | ORAL | Status: DC | PRN
  Administered 2020-12-12: 600 mg via ORAL
  Filled 2020-12-12 (×2): qty 1
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
## 2020-12-12 NOTE — Progress Notes (Signed)
Pt discharged home.  Discharge instructions, prescriptions and follow up appointment given to and reviewed with pt.  Pt verbalized understanding.  Escorted by auxillary. 
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
## 2020-12-13 LAB — PTT FACTOR INHIBITOR (MIXING STUDY): aPTT: 26.8 s (ref 22.9–30.2)

## 2020-12-27 LAB — VON WILLEBRAND FACTOR SCREEN
APTT: 28.2 s
Factor VIII Activity: 102 %
von Willebrand Factor Activity: 38 % — ABNORMAL LOW
von Willebrand Factor Antigen: 49 % — ABNORMAL LOW

## 2020-12-29 ENCOUNTER — Other Ambulatory Visit: Payer: Self-pay

## 2020-12-29 ENCOUNTER — Ambulatory Visit
Admission: EM | Admit: 2020-12-29 | Discharge: 2020-12-29 | Disposition: A | Discharge status: Home / Self Care | Payer: Medicaid Other | Attending: Family Medicine | Admitting: Family Medicine

## 2020-12-29 DIAGNOSIS — Z20822 Contact with and (suspected) exposure to covid-19: Secondary | ICD-10-CM | POA: Diagnosis not present

## 2020-12-29 DIAGNOSIS — B349 Viral infection, unspecified: Secondary | ICD-10-CM | POA: Insufficient documentation

## 2020-12-29 LAB — RESP PANEL BY RT-PCR (FLU A&B, COVID) ARPGX2
Influenza A by PCR: NEGATIVE
Influenza B by PCR: NEGATIVE
SARS Coronavirus 2 by RT PCR: NEGATIVE

## 2020-12-29 LAB — GROUP A STREP BY PCR: Group A Strep by PCR: NOT DETECTED

## 2020-12-29 NOTE — ED Triage Notes (Addendum)
Patient states that she has been having a sore throat and body aches since yesterday morning with chills. Patient states that she does have a newborn at home and would like to rule out covid, flu, strep.

## 2020-12-29 NOTE — Discharge Instructions (Signed)
Tylenol and ibuprofen as needed.  Testing Negative.   Take care  Dr. Adriana Simas
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
## 2021-03-15 ENCOUNTER — Ambulatory Visit
Admission: EM | Admit: 2021-03-15 | Discharge: 2021-03-15 | Discharge status: Against Medical Advice | Payer: Medicaid Other

## 2021-11-13 ENCOUNTER — Ambulatory Visit
Admission: EM | Admit: 2021-11-13 | Discharge: 2021-11-13 | Disposition: A | Discharge status: Home / Self Care | Payer: Medicaid Other | Attending: Emergency Medicine | Admitting: Emergency Medicine

## 2021-11-13 ENCOUNTER — Other Ambulatory Visit: Payer: Self-pay

## 2021-11-13 DIAGNOSIS — J111 Influenza due to unidentified influenza virus with other respiratory manifestations: Secondary | ICD-10-CM | POA: Diagnosis not present

## 2021-11-13 MED ORDER — OSELTAMIVIR PHOSPHATE 75 MG PO CAPS: 75.0000 mg | ORAL_CAPSULE | Freq: Two times a day (BID) | ORAL | 0 refills | Status: DC

## 2021-11-13 MED ORDER — IBUPROFEN 600 MG PO TABS
600.0000 mg | ORAL_TABLET | Freq: Four times a day (QID) | ORAL | 0 refills | Status: DC | PRN
Start: 2021-11-13 — End: 2023-07-29

## 2021-11-13 MED ORDER — ALBUTEROL SULFATE HFA 108 (90 BASE) MCG/ACT IN AERS: 1.0000 | INHALATION_SPRAY | RESPIRATORY_TRACT | 0 refills | Status: DC | PRN

## 2021-11-13 MED ORDER — ACETAMINOPHEN 500 MG PO TABS
1000.0000 mg | ORAL_TABLET | Freq: Four times a day (QID) | ORAL | 0 refills | Status: DC | PRN
Start: 2021-11-13 — End: 2022-05-28

## 2021-11-13 MED ORDER — AEROCHAMBER PLUS MISC: 2 refills | Status: AC

## 2021-11-13 NOTE — ED Triage Notes (Signed)
Pt c/o cough, fever, body aches, been exposed to flu from family

## 2021-11-13 NOTE — ED Provider Notes (Signed)
HPI  SUBJECTIVE:  Tiffany Diaz is a 30 y.o. female who presents with body aches, headaches, nasal congestion, rhinorrhea, wet cough starting yesterday.  No fevers, loss of sense of smell or taste, wheezing, nausea, vomiting, diarrhea, abdominal pain.  She has multiple family members with lab confirmed influenza, and brings in both of her children today with identical symptoms, who were clinically diagnosed with influenza.  She has a past medical history of asthma.  LMP: Yesterday.  Denies possibility being pregnant.  PMD: [facility] internal medicine.  Past Medical History:  Diagnosis Date   Asthma    Migraine     No past surgical history on file.  Family History  Problem Relation Age of Onset   Diabetes Sister     Social History   Tobacco Use   Smoking status: Never   Smokeless tobacco: Never  Vaping Use   Vaping Use: Never used  Substance Use Topics   Alcohol use: No   Drug use: Never    No current facility-administered medications for this encounter.  Current Outpatient Medications:    albuterol (VENTOLIN HFA) 108 (90 Base) MCG/ACT inhaler, Inhale 1-2 puffs into the lungs every 4 (four) hours as needed for wheezing or shortness of breath., Disp: 1 each, Rfl: 0   oseltamivir (TAMIFLU) 75 MG capsule, Take 1 capsule (75 mg total) by mouth 2 (two) times daily. X 5 days, Disp: 10 capsule, Rfl: 0   phentermine 15 MG capsule, Take 15 mg by mouth every morning., Disp: , Rfl:    Spacer/Aero-Holding Chambers (AEROCHAMBER PLUS) inhaler, Use with inhaler, Disp: 1 each, Rfl: 2   topiramate (TOPAMAX) 25 MG tablet, Take by mouth., Disp: , Rfl:    acetaminophen (TYLENOL) 500 MG tablet, Take 2 tablets (1,000 mg total) by mouth every 6 (six) hours as needed for moderate pain or mild pain., Disp: 30 tablet, Rfl: 0   ibuprofen (ADVIL) 600 MG tablet, Take 1 tablet (600 mg total) by mouth every 6 (six) hours as needed for mild pain or moderate pain., Disp: 30 tablet, Rfl: 0   Prenat w/o  A-FeCbGl-DSS-FA-DHA (CITRANATAL 90 DHA) 90-1 & 300 MG MISC, TAKE 1 TABLET AND 1 CAPSULE BY MOUTH ONCE DAILY, Disp: , Rfl:   Allergies  Allergen Reactions   Mangifera Indica    Pineapple Shortness Of Breath and Swelling   Morphine And Related Other (See Comments)    Severe hallucinations   Oxycodone-Acetaminophen      ROS  As noted in HPI.   Physical Exam  BP 120/81 (BP Location: Left Arm)    Pulse 95    Resp 16    Ht 5\' 3"  (1.6 m)    Wt 104.3 kg    SpO2 96%    BMI 40.73 kg/m    Constitutional: Well developed, well nourished, no acute distress Eyes:  EOMI, conjunctiva normal bilaterally HENT: Normocephalic, atraumatic,mucus membranes moist Respiratory: Normal inspiratory effort Cardiovascular: Normal rate GI: nondistended skin: No rash, skin intact Musculoskeletal: no deformities Neurologic: Alert & oriented x 3, no focal neuro deficits Psychiatric: Speech and behavior appropriate   ED Course   Medications - No data to display  No orders of the defined types were placed in this encounter.   No results found for this or any previous visit (from the past 24 hour(s)). No results found.  ED Clinical Impression  1. Influenza      ED Assessment/Plan  Patient with influenza-like illness.  Both of her children are here today with identical  symptoms and fevers, diagnosed with flu.  Did not test because of close exposure, and with history of asthma, she is at increased risk for severe illness.  Home with Tylenol/ibuprofen, refilling albuterol, will prescribe a spacer.  Tamiflu.  Discussed MDM, treatment plan, and plan for follow-up with patient. Discussed sn/sx that should prompt return to the ED. patient agrees with plan.   Meds ordered this encounter  Medications   acetaminophen (TYLENOL) 500 MG tablet    Sig: Take 2 tablets (1,000 mg total) by mouth every 6 (six) hours as needed for moderate pain or mild pain.    Dispense:  30 tablet    Refill:  0   ibuprofen  (ADVIL) 600 MG tablet    Sig: Take 1 tablet (600 mg total) by mouth every 6 (six) hours as needed for mild pain or moderate pain.    Dispense:  30 tablet    Refill:  0   albuterol (VENTOLIN HFA) 108 (90 Base) MCG/ACT inhaler    Sig: Inhale 1-2 puffs into the lungs every 4 (four) hours as needed for wheezing or shortness of breath.    Dispense:  1 each    Refill:  0   Spacer/Aero-Holding Chambers (AEROCHAMBER PLUS) inhaler    Sig: Use with inhaler    Dispense:  1 each    Refill:  2    Please educate patient on use   oseltamivir (TAMIFLU) 75 MG capsule    Sig: Take 1 capsule (75 mg total) by mouth 2 (two) times daily. X 5 days    Dispense:  10 capsule    Refill:  0      *This clinic note was created using Scientist, clinical (histocompatibility and immunogenetics). Therefore, there may be occasional mistakes despite careful proofreading.  ?    Domenick Gong, MD 11/13/21 1327

## 2021-11-13 NOTE — Discharge Instructions (Addendum)
600 mg of ibuprofen combined with 1000 mg of Tylenol together 3-4 times a day as needed for headache, body aches, headaches.  Drink plenty of extra fluids.  Finish the Tamiflu, even if you feel better

## 2022-05-28 ENCOUNTER — Ambulatory Visit
Admission: EM | Admit: 2022-05-28 | Discharge: 2022-05-28 | Disposition: A | Discharge status: Home / Self Care | Payer: Medicaid Other | Attending: Emergency Medicine | Admitting: Emergency Medicine

## 2022-05-28 ENCOUNTER — Encounter: Payer: Self-pay | Admitting: Emergency Medicine

## 2022-05-28 ENCOUNTER — Other Ambulatory Visit: Payer: Self-pay

## 2022-05-28 DIAGNOSIS — J4521 Mild intermittent asthma with (acute) exacerbation: Secondary | ICD-10-CM | POA: Diagnosis present

## 2022-05-28 DIAGNOSIS — H109 Unspecified conjunctivitis: Secondary | ICD-10-CM | POA: Diagnosis present

## 2022-05-28 DIAGNOSIS — J020 Streptococcal pharyngitis: Secondary | ICD-10-CM | POA: Diagnosis present

## 2022-05-28 DIAGNOSIS — Z20822 Contact with and (suspected) exposure to covid-19: Secondary | ICD-10-CM | POA: Insufficient documentation

## 2022-05-28 LAB — RESP PANEL BY RT-PCR (FLU A&B, COVID) ARPGX2
Influenza A by PCR: NEGATIVE
Influenza B by PCR: NEGATIVE
SARS Coronavirus 2 by RT PCR: NEGATIVE

## 2022-05-28 LAB — GROUP A STREP BY PCR: Group A Strep by PCR: DETECTED — AB

## 2022-05-28 MED ORDER — BENZONATATE 100 MG PO CAPS: 100.0000 mg | ORAL_CAPSULE | Freq: Three times a day (TID) | ORAL | 0 refills | Status: DC

## 2022-05-28 MED ORDER — PROMETHAZINE-DM 6.25-15 MG/5ML PO SYRP: 5.0000 mL | ORAL_SOLUTION | Freq: Four times a day (QID) | ORAL | 0 refills | Status: DC | PRN

## 2022-05-28 MED ORDER — PREDNISONE 20 MG PO TABS: 40.0000 mg | ORAL_TABLET | Freq: Every day | ORAL | 0 refills | Status: DC

## 2022-05-28 MED ORDER — MOXIFLOXACIN HCL 0.5 % OP SOLN: 1.0000 [drp] | Freq: Three times a day (TID) | OPHTHALMIC | 0 refills | Status: DC
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
## 2022-05-28 NOTE — ED Triage Notes (Addendum)
Pt c/o cough, sore throat, hoarseness, loss of smell. Started about 3 days ago. She states both of her kids recently had croup. She also has eye redness and irritation. Pt states she had a fever this morning of 102 but did not take anything. She does not currently have a fever. Pt states she has known asthma.

## 2022-05-28 NOTE — Discharge Instructions (Addendum)
COVID and flu testing negative  Your strep test is positive, take amoxicillin that you have already been prescribed as directed, ideally you will begin to see improvement in about 48 hours and steady progression from there  For your shortness of breath and wheezing, starting tomorrow begin use of prednisone every morning with food for the next 5 days, this is to help reduce inflammation and irritation to the upper airways and will help calm your breathing  Continue use of your albuterol inhaler taking 2 puffs every 4-6 hours as needed for wheezing and shortness of breath, Adderall is a rescue inhaler meant to be used as needed, as you do not take any daily inhalers currently I will not prescribe you any additional inhalers    You can take Tylenol and/or Ibuprofen as needed for fever reduction and pain relief.   For cough: honey 1/2 to 1 teaspoon (you can dilute the honey in water or another fluid).  You can also use guaifenesin and dextromethorphan for cough. You can use a humidifier for chest congestion and cough.  If you don't have a humidifier, you can sit in the bathroom with the hot shower running.      For sore throat: try warm salt water gargles, cepacol lozenges, throat spray, warm tea or water with lemon/honey, popsicles or ice, or OTC cold relief medicine for throat discomfort.   For congestion: take a daily anti-histamine like Zyrtec, Claritin, and a oral decongestant, such as pseudoephedrine.  You can also use Flonase 1-2 sprays in each nostril daily.   It is important to stay hydrated: drink plenty of fluids (water, gatorade/powerade/pedialyte, juices, or teas) to keep your throat moisturized and help further relieve irritation/discomfort.  Today you being treated for bacterial conjunctivitis.   Place 1 drop of moxifloxacin into the left eye 3 times daily (every 8 hours) for treatment of pinkeye, if you begin to have symptoms in your right eye may use medication as directed above  into the right eye, please do not allow tip of dropper to touch eyes  May use cool compress for comfort and to remove discharge if present. Pat the eye, do not wipe.  If wearing contacts, dispose of current pair. Wear glasses until symptoms have resolved.   Do not rub eyes, this may cause more irritation.  May use Claritin, Zyrtec or benadryl as needed to help if itching present.  Please avoid use of eye makeup until symptoms clear.  If symptoms persist after use of medication, please follow up at Urgent Care or with ophthalmologist (eye doctor)

## 2022-06-27 IMAGING — US US PELVIS COMPLETE TRANSABD/TRANSVAG W DUPLEX
1 series · 13 of 25 positions shown · non-contrast
Comparison: May 02, 2019

CLINICAL DATA: Vaginal delivery 5 weeks ago. Now with severe
bleeding and right lower quadrant pain.

EXAM:
TRANSABDOMINAL AND TRANSVAGINAL ULTRASOUND OF PELVIS
DOPPLER ULTRASOUND OF OVARIES
TECHNIQUE: Both transabdominal and transvaginal ultrasound examinations of the
pelvis were performed. Transabdominal technique was performed for
global imaging of the pelvis including uterus, ovaries, adnexal
regions, and pelvic cul-de-sac.
It was necessary to proceed with endovaginal exam following the
transabdominal exam to visualize the ovaries. Color and duplex
Doppler ultrasound was utilized to evaluate blood flow to the
ovaries.

[Series 1: us pelvic complete w transvaginal and torsion righ · 87 acquisitions, 13 frames shown]
[im 1/87]
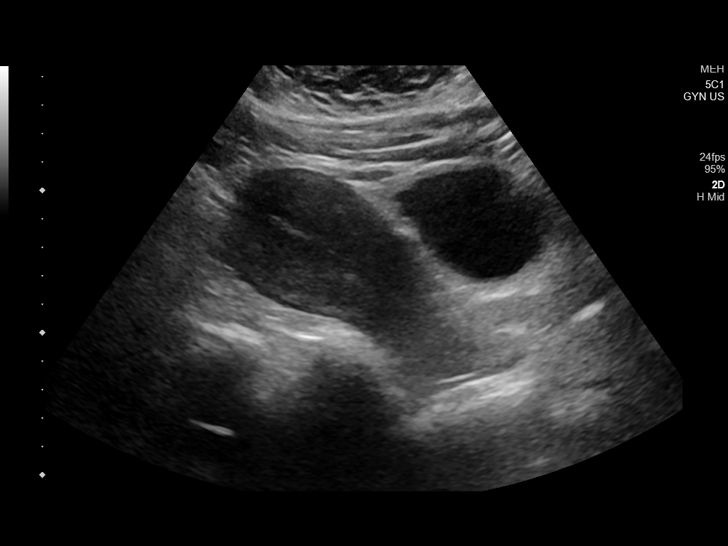
[im 8/87]
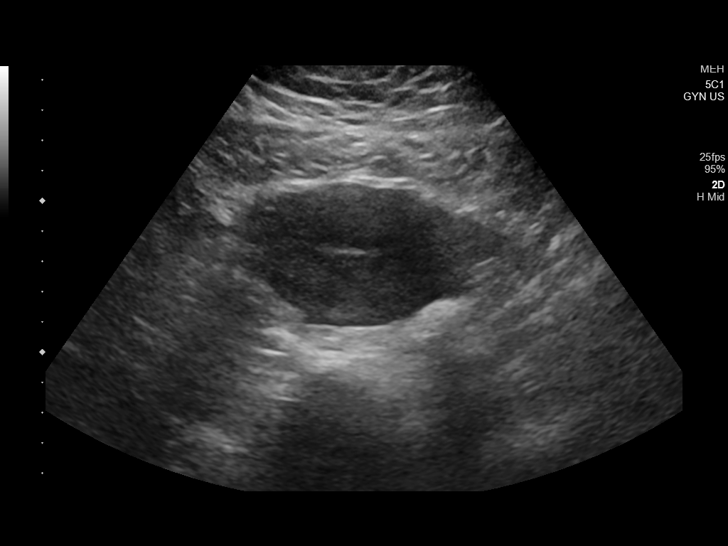
[im 15/87]
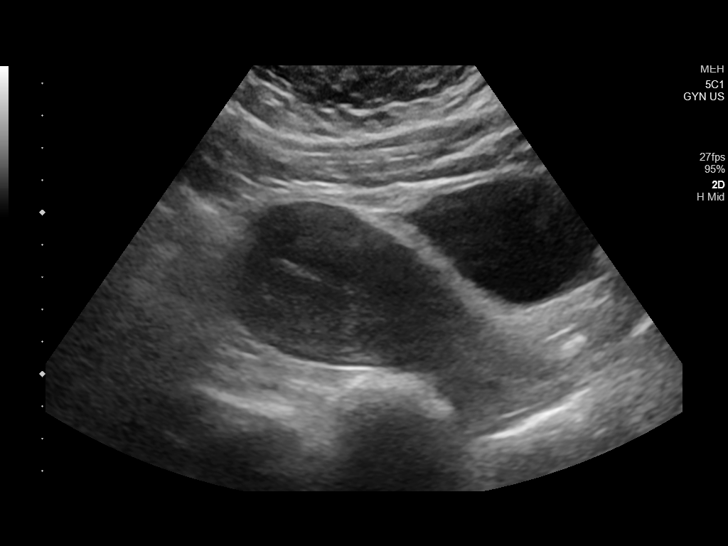
[im 22/87]
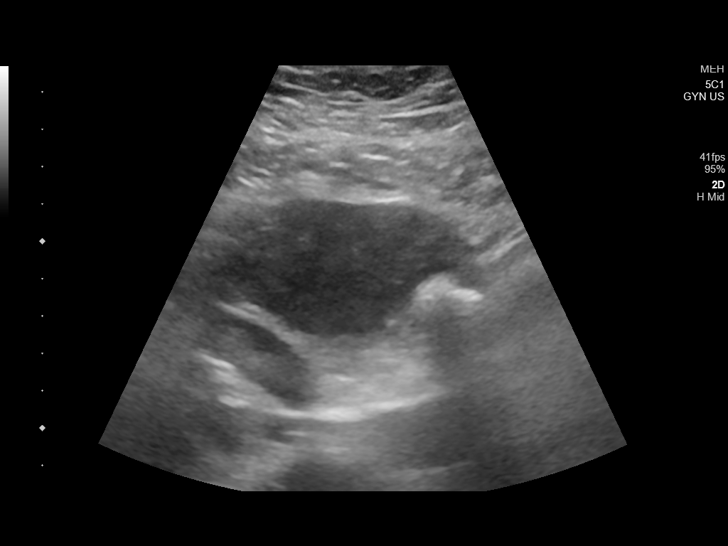
[im 29/87]
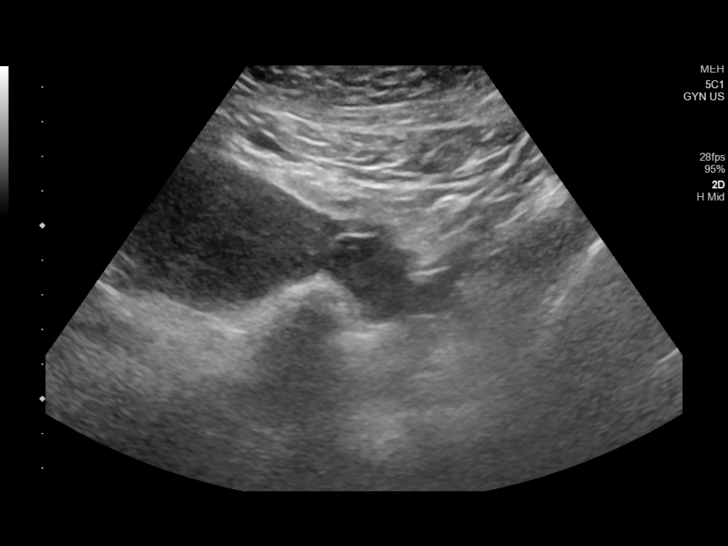
[im 36/87]
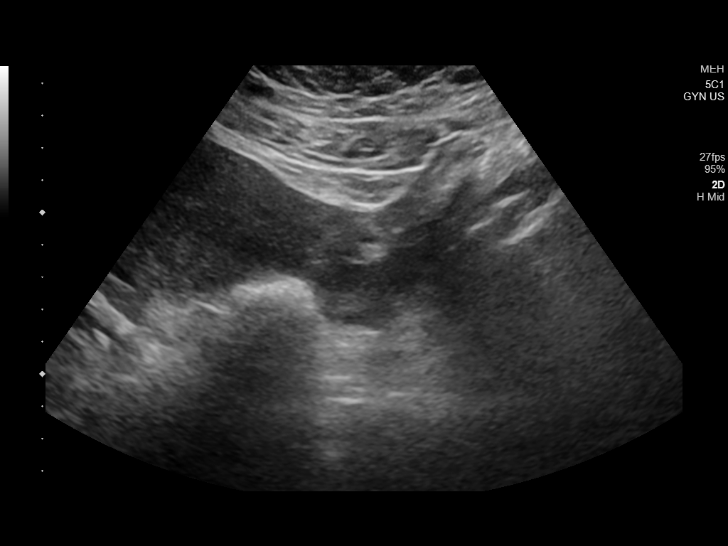
[im 44/87]
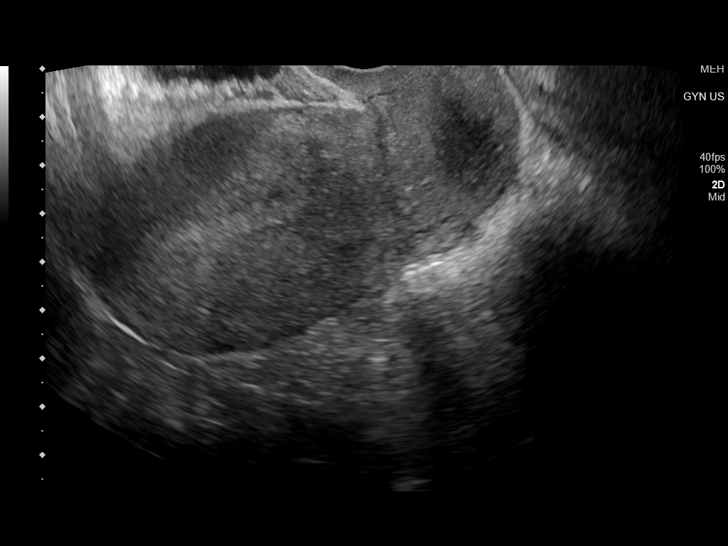
[im 51/87]
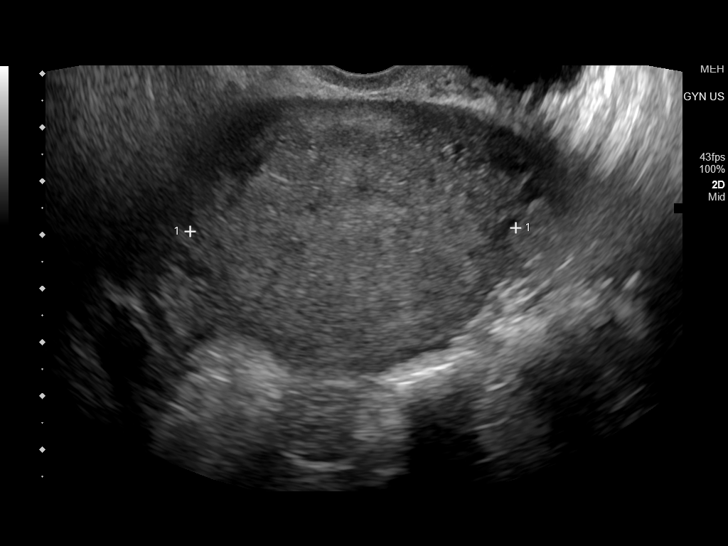
[im 58/87]
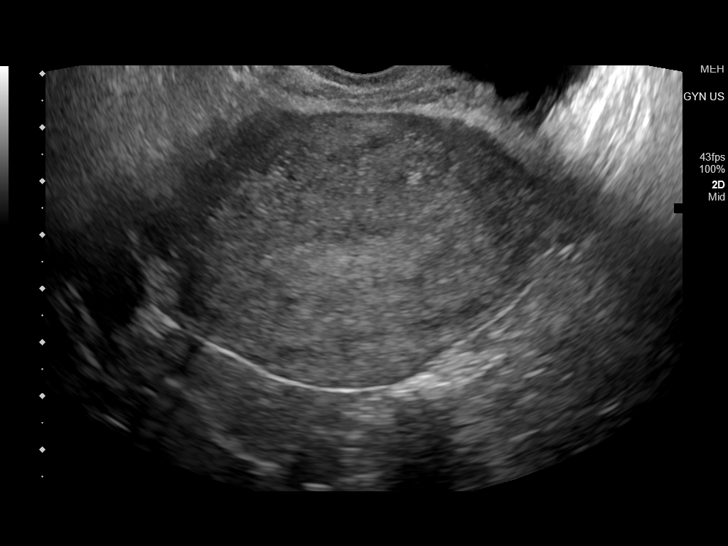
[im 65/87]
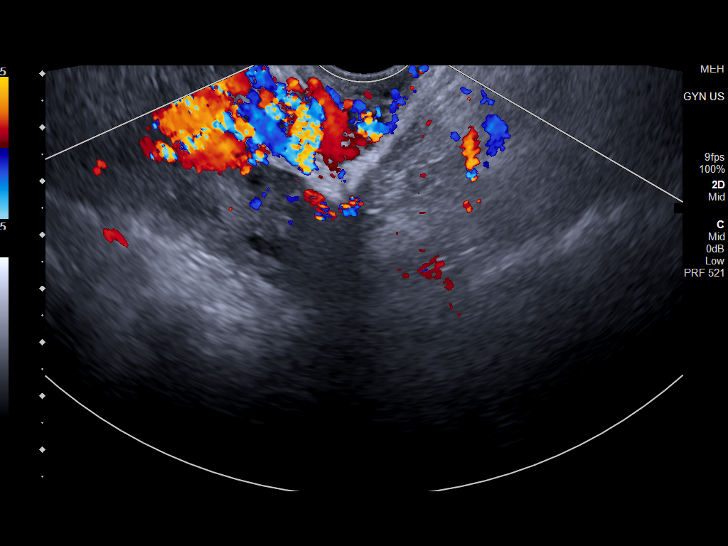
[im 72/87]
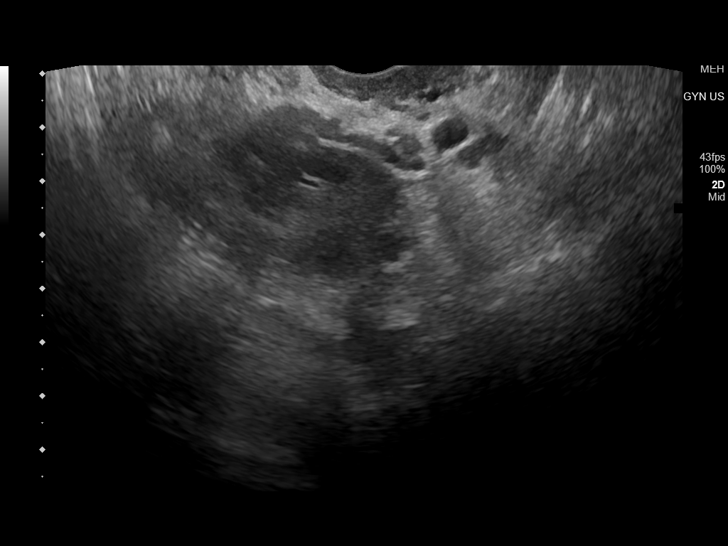
[im 79/87]
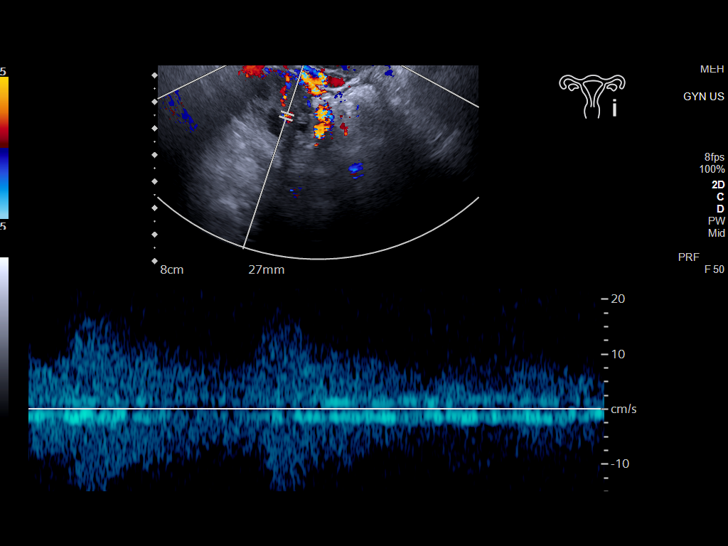
[im 87/87]
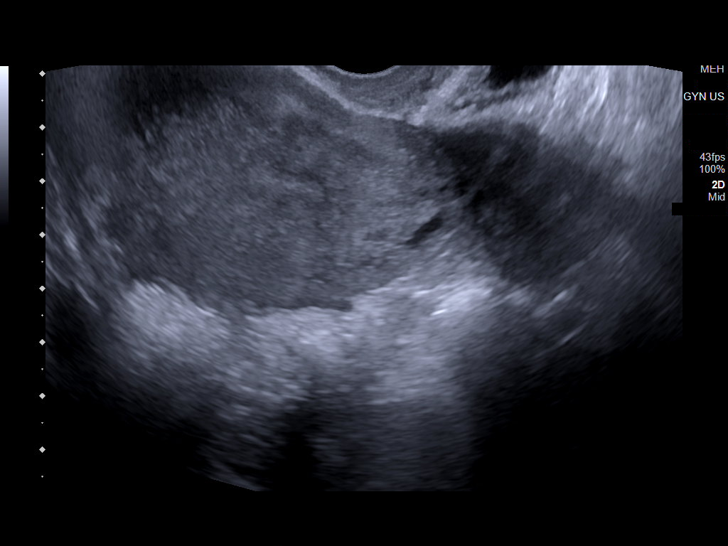

[13 of 25 positions shown; findings below may reference images not displayed]

FINDINGS: Uterus

Measurements: 9.2 x 4.8 x 6.1 cm = volume: 139 mL. No fibroids or
other mass visualized.

Endometrium

Thickness: 5 mm.  No focal abnormality visualized.

Right ovary

Measurements: 3.6 x 2.2 x 1.8 cm = volume: 7.5 mL. Normal
appearance/no adnexal mass.

Left ovary

Measurements: 4 x 2.3 x 2.3 cm = volume: 9.8 mL. Normal
appearance/no adnexal mass.

Pulsed Doppler evaluation of both ovaries demonstrates normal
low-resistance arterial and venous waveforms.

Other findings

No abnormal free fluid.
IMPRESSION: No acute abnormality detected. No abnormality identified to explain
the patient's symptoms.

## 2023-02-25 ENCOUNTER — Ambulatory Visit
Admission: EM | Admit: 2023-02-25 | Discharge: 2023-02-25 | Disposition: A | Discharge status: Home / Self Care | Payer: Medicaid Other | Attending: Family Medicine | Admitting: Family Medicine

## 2023-02-25 DIAGNOSIS — R059 Cough, unspecified: Secondary | ICD-10-CM | POA: Diagnosis not present

## 2023-02-25 DIAGNOSIS — R0981 Nasal congestion: Secondary | ICD-10-CM | POA: Diagnosis not present

## 2023-02-25 DIAGNOSIS — M791 Myalgia, unspecified site: Secondary | ICD-10-CM | POA: Diagnosis not present

## 2023-02-25 DIAGNOSIS — G479 Sleep disorder, unspecified: Secondary | ICD-10-CM | POA: Diagnosis not present

## 2023-02-25 DIAGNOSIS — R509 Fever, unspecified: Secondary | ICD-10-CM | POA: Diagnosis not present

## 2023-02-25 DIAGNOSIS — Z1152 Encounter for screening for COVID-19: Secondary | ICD-10-CM | POA: Insufficient documentation

## 2023-02-25 DIAGNOSIS — G43119 Migraine with aura, intractable, without status migrainosus: Secondary | ICD-10-CM

## 2023-02-25 LAB — GROUP A STREP BY PCR: Group A Strep by PCR: NOT DETECTED

## 2023-02-25 LAB — SARS CORONAVIRUS 2 BY RT PCR: SARS Coronavirus 2 by RT PCR: NEGATIVE

## 2023-02-25 MED ORDER — ONDANSETRON 4 MG PO TBDP
4.0000 mg | ORAL_TABLET | Freq: Once | ORAL | Status: AC
  Administered 2023-02-25: 4 mg via ORAL

## 2023-02-25 MED ORDER — SUMATRIPTAN SUCCINATE 100 MG PO TABS: 100.0000 mg | ORAL_TABLET | ORAL | 0 refills | Status: DC | PRN

## 2023-02-25 MED ORDER — ONDANSETRON 4 MG PO TBDP: 8.0000 mg | ORAL_TABLET | Freq: Three times a day (TID) | ORAL | 0 refills | Status: DC | PRN
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
## 2023-02-25 NOTE — Discharge Instructions (Addendum)
I will call you if your test COVID or strep results are positive. If your test is negative you may resume normal activities. Stop by the pharmacy to pick up your prescriptions.  Follow up with your primary care provider as needed.   You can take Tylenol and/or Ibuprofen as needed for fever reduction and pain relief.    For cough: honey 1/2 to 1 teaspoon (you can dilute the honey in water or another fluid).  You can also use guaifenesin and dextromethorphan for cough. You can use a humidifier for chest congestion and cough.  If you don't have a humidifier, you can sit in the bathroom with the hot shower running.      For sore throat: try warm salt water gargles, Mucinex sore throat cough drops or cepacol lozenges, throat spray, warm tea or water with lemon/honey, popsicles or ice, or OTC cold relief medicine for throat discomfort. You can also purchase chloraseptic spray at the pharmacy or dollar store.   For congestion: take a daily anti-histamine like Zyrtec, Claritin, and a oral decongestant, such as pseudoephedrine.  You can also use Flonase 1-2 sprays in each nostril daily. Afrin is also a good option, if you do not have high blood pressure.    It is important to stay hydrated: drink plenty of fluids (water, gatorade/powerade/pedialyte, juices, or teas) to keep your throat moisturized and help further relieve irritation/discomfort.    Return or go to the Emergency Department if symptoms worsen or do not improve in the next few days

## 2023-02-25 NOTE — ED Triage Notes (Signed)
Patient presents to UC for fever, fatigue, nausea, and fever since 3 days ago. Treating symptoms with mucinex.

## 2023-05-08 ENCOUNTER — Ambulatory Visit
Admission: EM | Admit: 2023-05-08 | Discharge: 2023-05-08 | Disposition: A | Discharge status: Home / Self Care | Payer: Medicaid Other

## 2023-05-08 NOTE — ED Triage Notes (Signed)
Pt called from lobby to no answer.

## 2023-05-08 NOTE — ED Triage Notes (Signed)
Pt called again from lobby to no answer. Front desk was asked about patient location and I was informed that the patient has left.

## 2023-05-10 ENCOUNTER — Encounter: Payer: Self-pay | Admitting: Emergency Medicine

## 2023-05-10 ENCOUNTER — Ambulatory Visit
Admission: EM | Admit: 2023-05-10 | Discharge: 2023-05-10 | Disposition: A | Discharge status: Home / Self Care | Payer: Medicaid Other | Attending: Physician Assistant | Admitting: Physician Assistant

## 2023-05-10 DIAGNOSIS — J029 Acute pharyngitis, unspecified: Secondary | ICD-10-CM | POA: Insufficient documentation

## 2023-05-10 DIAGNOSIS — R051 Acute cough: Secondary | ICD-10-CM | POA: Diagnosis present

## 2023-05-10 DIAGNOSIS — Z3202 Encounter for pregnancy test, result negative: Secondary | ICD-10-CM | POA: Insufficient documentation

## 2023-05-10 DIAGNOSIS — R0981 Nasal congestion: Secondary | ICD-10-CM | POA: Insufficient documentation

## 2023-05-10 DIAGNOSIS — Z760 Encounter for issue of repeat prescription: Secondary | ICD-10-CM | POA: Diagnosis present

## 2023-05-10 DIAGNOSIS — J069 Acute upper respiratory infection, unspecified: Secondary | ICD-10-CM | POA: Diagnosis present

## 2023-05-10 LAB — PREGNANCY, URINE: Preg Test, Ur: NEGATIVE

## 2023-05-10 LAB — GROUP A STREP BY PCR: Group A Strep by PCR: NOT DETECTED

## 2023-05-10 MED ORDER — IPRATROPIUM BROMIDE 0.06 % NA SOLN: 2.0000 | Freq: Four times a day (QID) | NASAL | 0 refills | Status: DC

## 2023-05-10 MED ORDER — PROMETHAZINE-DM 6.25-15 MG/5ML PO SYRP: 5.0000 mL | ORAL_SOLUTION | Freq: Four times a day (QID) | ORAL | 0 refills | Status: DC | PRN

## 2023-05-10 MED ORDER — ALBUTEROL SULFATE HFA 108 (90 BASE) MCG/ACT IN AERS: 1.0000 | INHALATION_SPRAY | Freq: Four times a day (QID) | RESPIRATORY_TRACT | 0 refills | Status: DC | PRN
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
## 2023-05-10 NOTE — ED Triage Notes (Signed)
Patient c/o sinus congestion and pressure, and nasal congestion for over a week.  Patient reports abdominal cramping and has not had her period this month.  Patient denies any fevers.

## 2023-05-10 NOTE — Discharge Instructions (Addendum)
-  Negative pregnancy test.  URI/COLD SYMPTOMS: Your exam today is consistent with a viral illness. Antibiotics are not indicated at this time. Use medications as directed, including cough syrup, nasal saline, and decongestants. Your symptoms should improve over the next few days and resolve within another 7-10 days. Increase rest and fluids. F/u if symptoms worsen or predominate such as sore throat, ear pain, productive cough, shortness of breath, or if you develop high fevers or worsening fatigue over the next several days.

## 2023-05-24 ENCOUNTER — Ambulatory Visit
Admission: EM | Admit: 2023-05-24 | Discharge: 2023-05-24 | Disposition: A | Discharge status: Home / Self Care | Payer: Medicaid Other | Attending: Physician Assistant | Admitting: Physician Assistant

## 2023-05-24 DIAGNOSIS — R519 Headache, unspecified: Secondary | ICD-10-CM | POA: Insufficient documentation

## 2023-05-24 DIAGNOSIS — R1110 Vomiting, unspecified: Secondary | ICD-10-CM | POA: Diagnosis not present

## 2023-05-24 DIAGNOSIS — H66001 Acute suppurative otitis media without spontaneous rupture of ear drum, right ear: Secondary | ICD-10-CM | POA: Insufficient documentation

## 2023-05-24 DIAGNOSIS — J209 Acute bronchitis, unspecified: Secondary | ICD-10-CM | POA: Diagnosis not present

## 2023-05-24 DIAGNOSIS — J029 Acute pharyngitis, unspecified: Secondary | ICD-10-CM | POA: Insufficient documentation

## 2023-05-24 DIAGNOSIS — Z1152 Encounter for screening for COVID-19: Secondary | ICD-10-CM | POA: Diagnosis not present

## 2023-05-24 LAB — SARS CORONAVIRUS 2 BY RT PCR: SARS Coronavirus 2 by RT PCR: NEGATIVE

## 2023-05-24 LAB — GROUP A STREP BY PCR: Group A Strep by PCR: NOT DETECTED

## 2023-05-24 MED ORDER — AMOXICILLIN-POT CLAVULANATE 875-125 MG PO TABS: 1.0000 | ORAL_TABLET | Freq: Two times a day (BID) | ORAL | 0 refills | Status: AC

## 2023-05-24 MED ORDER — ALBUTEROL SULFATE HFA 108 (90 BASE) MCG/ACT IN AERS: 1.0000 | INHALATION_SPRAY | Freq: Four times a day (QID) | RESPIRATORY_TRACT | 0 refills | Status: AC | PRN

## 2023-05-24 MED ORDER — PROMETHAZINE-DM 6.25-15 MG/5ML PO SYRP
5.0000 mL | ORAL_SOLUTION | Freq: Four times a day (QID) | ORAL | 0 refills | Status: DC | PRN
Start: 2023-05-24 — End: 2023-07-29

## 2023-05-24 MED ORDER — PREDNISONE 10 MG PO TABS: 10.0000 mg | ORAL_TABLET | Freq: Every day | ORAL | 0 refills | Status: DC

## 2023-05-24 NOTE — ED Triage Notes (Signed)
Pt c/o ear pain, sore throat, headache, vomting x2days  Pt states that her throat hurts when she swallows.  Pt states that [place] felt sick [place] and since then she has had cough, congestion, and ear pain.  Pt has an inhaler and has asthma.   Pt took Promethazine for cough.  Pt asks for a strep and covid test.   Pt asks for a refill on her inhaler  Pt did not pick up her prescriptions for an inhaler or cough medication sent in on 6.9.24 to [facility] on [address].

## 2023-05-24 NOTE — Discharge Instructions (Signed)
Please take medications as prescribed.  Return to the urgent care for any worsening symptoms or any urgent changes in your health.
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
## 2023-07-29 ENCOUNTER — Ambulatory Visit
Admission: EM | Admit: 2023-07-29 | Discharge: 2023-07-29 | Disposition: A | Discharge status: Home / Self Care | Payer: Medicaid Other | Attending: Family Medicine | Admitting: Family Medicine

## 2023-07-29 DIAGNOSIS — R300 Dysuria: Secondary | ICD-10-CM | POA: Diagnosis present

## 2023-07-29 DIAGNOSIS — B9689 Other specified bacterial agents as the cause of diseases classified elsewhere: Secondary | ICD-10-CM | POA: Diagnosis present

## 2023-07-29 DIAGNOSIS — N760 Acute vaginitis: Secondary | ICD-10-CM | POA: Insufficient documentation

## 2023-07-29 LAB — URINALYSIS, W/ REFLEX TO CULTURE (INFECTION SUSPECTED)

## 2023-07-29 LAB — WET PREP, GENITAL
Sperm: NONE SEEN
Trich, Wet Prep: NONE SEEN
WBC, Wet Prep HPF POC: NONE SEEN — AB (ref <10)
Yeast Wet Prep HPF POC: NONE SEEN

## 2023-07-29 MED ORDER — NITROFURANTOIN MONOHYD MACRO 100 MG PO CAPS: 100.0000 mg | ORAL_CAPSULE | Freq: Two times a day (BID) | ORAL | 0 refills | Status: DC

## 2023-07-29 MED ORDER — METRONIDAZOLE 500 MG PO TABS: 500.0000 mg | ORAL_TABLET | Freq: Two times a day (BID) | ORAL | 0 refills | Status: DC

## 2023-07-29 NOTE — ED Triage Notes (Signed)
Pt c/o urinary freq,burning & pain x3 days. Denies any hematuria. States was at the beach this weekend. Has tried monistat w/o relief.

## 2023-07-29 NOTE — Discharge Instructions (Addendum)
See handout on BV/bacterial vaginosis.  Take metronidazole as prescribed.    We will start you on antibiotics for a urinary tract infection until your urine culture results. Someone may contact you to stop or change your antibiotics, if needed.
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
## 2023-07-31 LAB — URINE CULTURE: Culture: 50000 — AB

## 2024-05-29 ENCOUNTER — Ambulatory Visit
Admission: EM | Admit: 2024-05-29 | Discharge: 2024-05-29 | Disposition: A | Discharge status: Home / Self Care | Attending: Family Medicine | Admitting: Family Medicine

## 2024-05-29 DIAGNOSIS — G43109 Migraine with aura, not intractable, without status migrainosus: Secondary | ICD-10-CM | POA: Diagnosis not present

## 2024-05-29 DIAGNOSIS — J4540 Moderate persistent asthma, uncomplicated: Secondary | ICD-10-CM

## 2024-05-29 DIAGNOSIS — W57XXXA Bitten or stung by nonvenomous insect and other nonvenomous arthropods, initial encounter: Secondary | ICD-10-CM

## 2024-05-29 MED ORDER — BUTALBITAL-APAP-CAFFEINE 50-325-40 MG PO TABS: 1.0000 | ORAL_TABLET | Freq: Every day | ORAL | 0 refills | Status: AC | PRN

## 2024-05-29 MED ORDER — PROMETHAZINE-DM 6.25-15 MG/5ML PO SYRP: 5.0000 mL | ORAL_SOLUTION | Freq: Four times a day (QID) | ORAL | 0 refills | Status: AC | PRN

## 2024-05-29 MED ORDER — PREDNISONE 10 MG (21) PO TBPK: ORAL_TABLET | Freq: Every day | ORAL | 0 refills | Status: AC

## 2024-05-29 MED ORDER — KETOROLAC TROMETHAMINE 60 MG/2ML IM SOLN
30.0000 mg | Freq: Once | INTRAMUSCULAR | Status: AC
  Administered 2024-05-29: 30 mg via INTRAMUSCULAR

## 2024-05-29 MED ORDER — CEFDINIR 300 MG PO CAPS: 300.0000 mg | ORAL_CAPSULE | Freq: Two times a day (BID) | ORAL | 0 refills | Status: AC

## 2024-05-29 NOTE — Discharge Instructions (Signed)
 Stop by the pharmacy to pick up your prescriptions.  Follow up with your primary care provider or return to the urgent care, if not improving.   Follow up with your neurologist regarding your change in symptoms and frequency of headaches.  Go to the hospital emergency department, if headache worsens or your become confused.
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
## 2024-05-29 NOTE — ED Triage Notes (Addendum)
 Patient went to PCP 2 days ago. Was tested for everything. Negative. Still having fevers. Patient states that she's had her sx x 1.5 weeks. Cough- was given Symbicort- fever Emesis- headache; ask for injection.  Bodyaches   Bite on right nipple x 1 week. Patient states that she went to visit a friend who had roaches. Unsure if she was bitten by one.
